# Patient Record
Sex: Female | Born: 1960 | Race: White | Hispanic: No | Marital: Married | Smoking: Never smoker
Health system: Southern US, Community
[De-identification: ages and names within clinical notes are randomized; demographics above are authoritative.]

## PROBLEM LIST (undated history)

## (undated) DIAGNOSIS — T7840XA Allergy, unspecified, initial encounter: Secondary | ICD-10-CM

## (undated) DIAGNOSIS — R011 Cardiac murmur, unspecified: Secondary | ICD-10-CM

## (undated) DIAGNOSIS — M1990 Unspecified osteoarthritis, unspecified site: Secondary | ICD-10-CM

## (undated) DIAGNOSIS — R51 Headache: Secondary | ICD-10-CM

## (undated) DIAGNOSIS — J40 Bronchitis, not specified as acute or chronic: Secondary | ICD-10-CM

## (undated) DIAGNOSIS — J189 Pneumonia, unspecified organism: Secondary | ICD-10-CM

## (undated) DIAGNOSIS — K219 Gastro-esophageal reflux disease without esophagitis: Secondary | ICD-10-CM

## (undated) DIAGNOSIS — E039 Hypothyroidism, unspecified: Secondary | ICD-10-CM

## (undated) DIAGNOSIS — J302 Other seasonal allergic rhinitis: Secondary | ICD-10-CM

## (undated) DIAGNOSIS — R519 Headache, unspecified: Secondary | ICD-10-CM

## (undated) HISTORY — PX: KNEE ARTHROSCOPY: SUR90

## (undated) HISTORY — DX: Unspecified osteoarthritis, unspecified site: M19.90

## (undated) HISTORY — DX: Cardiac murmur, unspecified: R01.1

## (undated) HISTORY — DX: Allergy, unspecified, initial encounter: T78.40XA

## (undated) HISTORY — PX: COLONOSCOPY: SHX5424

## (undated) HISTORY — DX: Gastro-esophageal reflux disease without esophagitis: K21.9

---

## 1998-06-04 HISTORY — PX: CHOLECYSTECTOMY: SHX55

## 2000-04-22 ENCOUNTER — Observation Stay (HOSPITAL_COMMUNITY): Admission: RE | Admit: 2000-04-22 | Discharge: 2000-04-23 | Payer: Self-pay | Admitting: General Surgery

## 2000-04-22 ENCOUNTER — Encounter (INDEPENDENT_AMBULATORY_CARE_PROVIDER_SITE_OTHER): Payer: Self-pay

## 2002-01-01 ENCOUNTER — Ambulatory Visit (HOSPITAL_BASED_OUTPATIENT_CLINIC_OR_DEPARTMENT_OTHER): Admission: RE | Admit: 2002-01-01 | Discharge: 2002-01-01 | Payer: Self-pay | Admitting: Orthopedic Surgery

## 2003-06-05 HISTORY — PX: KNEE SURGERY: SHX244

## 2004-02-14 ENCOUNTER — Other Ambulatory Visit: Admission: RE | Admit: 2004-02-14 | Discharge: 2004-02-14 | Payer: Self-pay | Admitting: Internal Medicine

## 2004-02-14 ENCOUNTER — Ambulatory Visit (HOSPITAL_COMMUNITY): Admission: RE | Admit: 2004-02-14 | Discharge: 2004-02-14 | Payer: Self-pay | Admitting: Internal Medicine

## 2008-06-17 ENCOUNTER — Ambulatory Visit: Payer: Self-pay | Admitting: *Deleted

## 2008-06-17 ENCOUNTER — Ambulatory Visit: Payer: Self-pay | Admitting: Diagnostic Radiology

## 2008-06-17 ENCOUNTER — Other Ambulatory Visit: Admission: RE | Admit: 2008-06-17 | Discharge: 2008-06-17 | Payer: Self-pay | Admitting: *Deleted

## 2008-06-17 ENCOUNTER — Encounter (INDEPENDENT_AMBULATORY_CARE_PROVIDER_SITE_OTHER): Payer: Self-pay | Admitting: *Deleted

## 2008-06-17 ENCOUNTER — Ambulatory Visit (HOSPITAL_BASED_OUTPATIENT_CLINIC_OR_DEPARTMENT_OTHER): Admission: RE | Admit: 2008-06-17 | Discharge: 2008-06-17 | Payer: Self-pay | Admitting: *Deleted

## 2008-06-17 DIAGNOSIS — K219 Gastro-esophageal reflux disease without esophagitis: Secondary | ICD-10-CM

## 2008-06-17 DIAGNOSIS — M1990 Unspecified osteoarthritis, unspecified site: Secondary | ICD-10-CM | POA: Insufficient documentation

## 2008-06-17 DIAGNOSIS — J309 Allergic rhinitis, unspecified: Secondary | ICD-10-CM | POA: Insufficient documentation

## 2008-06-17 DIAGNOSIS — R17 Unspecified jaundice: Secondary | ICD-10-CM | POA: Insufficient documentation

## 2008-06-17 DIAGNOSIS — R011 Cardiac murmur, unspecified: Secondary | ICD-10-CM | POA: Insufficient documentation

## 2008-06-17 LAB — CONVERTED CEMR LAB
Basophils Absolute: 0.1 10*3/uL (ref 0.0–0.1)
CO2: 28 meq/L (ref 19–32)
Calcium: 8.7 mg/dL (ref 8.4–10.5)
Cholesterol: 159 mg/dL (ref 0–200)
Eosinophils Relative: 2.5 % (ref 0.0–5.0)
GFR calc Af Amer: 68 mL/min
GFR calc non Af Amer: 57 mL/min
Glucose, Bld: 84 mg/dL (ref 70–99)
HDL: 51.4 mg/dL (ref >39.0)
MCHC: 34.2 g/dL (ref 30.0–36.0)
Monocytes Absolute: 0.5 10*3/uL (ref 0.1–1.0)
Neutro Abs: 3.3 10*3/uL (ref 1.4–7.7)
Neutrophils Relative %: 62.4 % (ref 43.0–77.0)
Platelets: 248 10*3/uL (ref 150–400)
Potassium: 4 meq/L (ref 3.5–5.1)
RDW: 13.1 % (ref 11.5–14.6)
Sodium: 140 meq/L (ref 135–145)
TSH: 4.4 microintl units/mL (ref 0.35–5.50)
VLDL: 13 mg/dL (ref 0–40)

## 2008-10-13 ENCOUNTER — Ambulatory Visit: Payer: Self-pay | Admitting: Family Medicine

## 2008-11-22 ENCOUNTER — Ambulatory Visit: Payer: Self-pay | Admitting: Family Medicine

## 2008-11-22 DIAGNOSIS — J019 Acute sinusitis, unspecified: Secondary | ICD-10-CM

## 2009-03-24 ENCOUNTER — Ambulatory Visit: Payer: Self-pay | Admitting: Family Medicine

## 2009-03-24 DIAGNOSIS — J069 Acute upper respiratory infection, unspecified: Secondary | ICD-10-CM | POA: Insufficient documentation

## 2009-03-24 LAB — CONVERTED CEMR LAB: Rapid Strep: NEGATIVE

## 2009-08-30 ENCOUNTER — Ambulatory Visit: Payer: Self-pay | Admitting: Family Medicine

## 2009-09-13 ENCOUNTER — Ambulatory Visit: Payer: Self-pay | Admitting: Family Medicine

## 2009-09-13 ENCOUNTER — Ambulatory Visit: Payer: Self-pay | Admitting: Diagnostic Radiology

## 2009-09-13 ENCOUNTER — Ambulatory Visit (HOSPITAL_BASED_OUTPATIENT_CLINIC_OR_DEPARTMENT_OTHER): Admission: RE | Admit: 2009-09-13 | Discharge: 2009-09-13 | Payer: Self-pay | Admitting: Family Medicine

## 2009-09-13 DIAGNOSIS — L738 Other specified follicular disorders: Secondary | ICD-10-CM | POA: Insufficient documentation

## 2009-09-13 LAB — HM MAMMOGRAPHY: HM Mammogram: NORMAL

## 2009-09-14 LAB — CONVERTED CEMR LAB
ALT: 12 units/L (ref 0–35)
AST: 17 units/L (ref 0–37)
Basophils Relative: 0.8 % (ref 0.0–3.0)
CO2: 25 meq/L (ref 19–32)
Calcium: 8.4 mg/dL (ref 8.4–10.5)
Chloride: 107 meq/L (ref 96–112)
Cholesterol: 156 mg/dL (ref 0–200)
HDL: 49.8 mg/dL (ref >39.00)
Hemoglobin: 13.6 g/dL (ref 12.0–15.0)
LDL Cholesterol: 95 mg/dL (ref 0–99)
Lymphocytes Relative: 25.6 % (ref 12.0–46.0)
MCHC: 34.1 g/dL (ref 30.0–36.0)
MCV: 85 fL (ref 78.0–100.0)
Neutro Abs: 3.8 10*3/uL (ref 1.4–7.7)
Neutrophils Relative %: 64.1 % (ref 43.0–77.0)
Potassium: 3.9 meq/L (ref 3.5–5.1)
Sodium: 140 meq/L (ref 135–145)
TSH: 5.53 microintl units/mL — ABNORMAL HIGH (ref 0.35–5.50)
Total Bilirubin: 1 mg/dL (ref 0.3–1.2)
Total CHOL/HDL Ratio: 3
Total Protein: 7.3 g/dL (ref 6.0–8.3)
Triglycerides: 56 mg/dL (ref 0.0–149.0)

## 2009-09-19 ENCOUNTER — Telehealth (INDEPENDENT_AMBULATORY_CARE_PROVIDER_SITE_OTHER): Payer: Self-pay | Admitting: *Deleted

## 2009-09-19 LAB — CONVERTED CEMR LAB: Free T4: 0.9 ng/dL (ref 0.6–1.6)

## 2009-09-28 ENCOUNTER — Other Ambulatory Visit: Admission: RE | Admit: 2009-09-28 | Discharge: 2009-09-28 | Payer: Self-pay | Admitting: Family Medicine

## 2009-09-28 ENCOUNTER — Ambulatory Visit: Payer: Self-pay | Admitting: Family Medicine

## 2009-09-28 DIAGNOSIS — E559 Vitamin D deficiency, unspecified: Secondary | ICD-10-CM | POA: Insufficient documentation

## 2009-10-04 ENCOUNTER — Encounter (INDEPENDENT_AMBULATORY_CARE_PROVIDER_SITE_OTHER): Payer: Self-pay | Admitting: *Deleted

## 2009-10-04 LAB — CONVERTED CEMR LAB: Pap Smear: NEGATIVE

## 2009-10-11 ENCOUNTER — Ambulatory Visit: Payer: Self-pay | Admitting: Family Medicine

## 2009-11-16 ENCOUNTER — Ambulatory Visit: Payer: Self-pay | Admitting: Family Medicine

## 2009-12-22 ENCOUNTER — Ambulatory Visit: Payer: Self-pay | Admitting: Family Medicine

## 2009-12-22 DIAGNOSIS — L049 Acute lymphadenitis, unspecified: Secondary | ICD-10-CM | POA: Insufficient documentation

## 2009-12-23 LAB — CONVERTED CEMR LAB
HCT: 38.5 % (ref 36.0–46.0)
Lymphocytes Relative: 39.4 % (ref 12.0–46.0)
Lymphs Abs: 2.2 10*3/uL (ref 0.7–4.0)
MCV: 86.7 fL (ref 78.0–100.0)
Monocytes Relative: 10.7 % (ref 3.0–12.0)
Platelets: 285 10*3/uL (ref 150.0–400.0)
RBC: 4.45 M/uL (ref 3.87–5.11)
RDW: 13.9 % (ref 11.5–14.6)
WBC: 5.6 10*3/uL (ref 4.5–10.5)

## 2010-03-27 ENCOUNTER — Ambulatory Visit: Payer: Self-pay | Admitting: Family Medicine

## 2010-03-27 DIAGNOSIS — E079 Disorder of thyroid, unspecified: Secondary | ICD-10-CM | POA: Insufficient documentation

## 2010-03-27 DIAGNOSIS — L723 Sebaceous cyst: Secondary | ICD-10-CM

## 2010-03-27 LAB — CONVERTED CEMR LAB
Free T4: 0.72 ng/dL (ref 0.60–1.60)
T3, Free: 2.8 pg/mL (ref 2.3–4.2)

## 2010-06-08 ENCOUNTER — Ambulatory Visit
Admission: RE | Admit: 2010-06-08 | Discharge: 2010-06-08 | Payer: Self-pay | Source: Home / Self Care | Attending: Family Medicine | Admitting: Family Medicine

## 2010-06-19 ENCOUNTER — Ambulatory Visit
Admission: RE | Admit: 2010-06-19 | Discharge: 2010-06-19 | Payer: Self-pay | Source: Home / Self Care | Attending: Family Medicine | Admitting: Family Medicine

## 2010-07-04 NOTE — Assessment & Plan Note (Signed)
Summary: bad cough//lch   Vital Signs:  Patient profile:   50 year old female Weight:      197 pounds Temp:     98.3 degrees F oral Pulse rate:   68 / minute Pulse rhythm:   regular BP sitting:   132 / 84  (left arm) Cuff size:   large  Vitals Entered By: Army Fossa CMA (August 30, 2009 4:16 PM) CC: Pt here for cough x 3 weeks, worse at night. Has tried nyquil., Cough   History of Present Illness:  Cough      This is a 50 year old woman who presents with Cough.  The symptoms began 3 weeks ago.  The patient reports productive cough and wheezing, but denies non-productive cough, pleuritic chest pain, shortness of breath, exertional dyspnea, fever, hemoptysis, and malaise.  The patient denies the following symptoms: cold/URI symptoms, sore throat, nasal congestion, chronic rhinitis, weight loss, acid reflux symptoms, and peripheral edema.  The cough is worse with activity and lying down.  Ineffective prior treatments have included OTC cough medication.    Current Medications (verified): 1)  Advil 200 Mg Tabs (Ibuprofen) .... Over The Counter As Per The Bottle Instruction 2)  Claritin 10 Mg Tabs (Loratadine) .Marland Kitchen.. 1 By Mouth Daily. 3)  Nasonex 50 Mcg/act Susp (Mometasone Furoate) .... 2 Sprays Each Nostril Once Daily 4)  Zithromax Z-Pak 250 Mg Tabs (Azithromycin) .... As Directed 5)  Symbicort 160-4.5 Mcg/act Aero (Budesonide-Formoterol Fumarate) .... 2 Puffs Two Times A Day 6)  Tussionex Pennkinetic Er 8-10 Mg/57ml Lqcr (Chlorpheniramine-Hydrocodone) .Marland Kitchen.. 1 Tsp By Mouth At Bedtime As Needed  Allergies (verified): No Known Drug Allergies  Past History:  Past medical, surgical, family and social histories (including risk factors) reviewed for relevance to current acute and chronic problems.  Past Medical History: Reviewed history from 06/17/2008 and no changes required. Allergic rhinitis GERD - doing well with dietary / behavioral changes Osteoarthritis - advil over the counter  as needed  Cardiac Murmur - worked up in her 20's with ECHO, etc - benign murmur  Past Surgical History: Reviewed history from 06/17/2008 and no changes required. Knee surgery - Cadaver replacement of cartilage - 2005 Cholecystectomy  Family History: Reviewed history from 06/17/2008 and no changes required. Family History of Alcoholism/Addiction Family History of Arthritis Family History High cholesterol Family History Hypertension Family History of  Breast Cancer Family History of  Emotional/Mental Illness  Social History: Reviewed history from 11/22/2008 and no changes required. 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  Otoscopic examination reveals clear canals, tympanic membranes are intact bilaterally without bulging, retraction, inflammation or discharge. Hearing is grossly normal bilaterally. Nose:  External nasal examination shows no deformity or inflammation. Nasal mucosa are pink and moist without lesions or exudates. Mouth:  Oral mucosa and oropharynx without lesions or exudates.  Teeth in good repair. Neck:  No deformities, masses, or tenderness noted. Lungs:  R wheezes and L wheezes.   Heart:  normal rate and no murmur.   Psych:  Oriented X3 and normally interactive.     Impression & Recommendations:  Problem # 1:  BRONCHITIS- ACUTE (ICD-466.0)  Her updated medication list for this problem includes:    Zithromax Z-pak  250 Mg Tabs (Azithromycin) .Marland Kitchen... As directed    Symbicort 160-4.5 Mcg/act Aero (Budesonide-formoterol fumarate) .Marland Kitchen... 2 puffs two times a day    Tussionex Pennkinetic Er 8-10 Mg/36ml Lqcr (Chlorpheniramine-hydrocodone)  .Marland Kitchen... 1 tsp by mouth at bedtime as needed  Take antibiotics and other medications as directed. Encouraged to push clear liquids, get enough rest, and take acetaminophen as needed. To be seen in 5-7 days if no improvement, sooner if worse.  Complete Medication List: 1)  Advil 200 Mg Tabs (Ibuprofen) .... Over the counter as per the bottle instruction 2)  Claritin 10 Mg Tabs (Loratadine) .Marland Kitchen.. 1 by mouth daily. 3)  Nasonex 50 Mcg/act Susp (Mometasone furoate) .... 2 sprays each nostril once daily 4)  Zithromax Z-pak 250 Mg Tabs (Azithromycin) .... As directed 5)  Symbicort 160-4.5 Mcg/act Aero (Budesonide-formoterol fumarate) .... 2 puffs two times a day 6)  Tussionex Pennkinetic Er 8-10 Mg/61ml Lqcr (Chlorpheniramine-hydrocodone) .Marland Kitchen.. 1 tsp by mouth at bedtime as needed Prescriptions: TUSSIONEX PENNKINETIC ER 8-10 MG/5ML LQCR (CHLORPHENIRAMINE-HYDROCODONE) 1 tsp by mouth at bedtime as needed  #6  oz x 0   Entered and Authorized by:   Loreen Freud DO   Signed by:   Loreen Freud DO on 08/30/2009   Method used:   Print then Give to Patient   RxID:   [id number] ZITHROMAX Z-PAK 250 MG TABS (AZITHROMYCIN) as directed  #1 x 0   Entered and Authorized by:   Loreen Freud DO   Signed by:   Loreen Freud DO on 08/30/2009   Method used:   Electronically to        [facility] (retail)       [address]       [place], [place]  [address]       Ph: [phone] or [phone]       Fax: [phone]   RxID:   [id number]

## 2010-07-04 NOTE — Assessment & Plan Note (Signed)
Summary: swollen glands in neck//kn   Vital Signs:  Patient profile:   50 year old female Height:      62.50 inches (158.75 cm) Weight:      199.50 pounds (90.68 kg) BMI:     36.04 Temp:     97.9 degrees F (36.61 degrees C) oral BP sitting:   120 / 70  (left arm) Cuff size:   regular  Vitals Entered By: Lucious Groves CMA (December 22, 2009 4:13 PM) CC: C/O glands swolen in neck./kb Is Patient Diabetic? No Pain Assessment Patient in pain? yes     Location: ears/neck Intensity: 4 Type: aching Onset of pain  5 days ago Comments Patient notes that the pain is bilateral behind the ears and accross the back of the neck. Patient has had HA, but denies fever./kb   History of Present Illness: 50 yo woman here today for swollen glands in neck.  behind R ear and on L scalp and neck.  + pain.  first noticed on Sunday.  no fevers, no ear pain, sore throat, nasal congestion.  only thing bothering pt is dry scalp condition that flared on Friday/Saturday.  no known sick contacts.  Current Medications (verified): 1)  Advil 200 Mg Tabs (Ibuprofen) .... Over The Counter As Per The Bottle Instruction 2)  Zyrtec Allergy 10 Mg Tabs (Cetirizine Hcl) .Marland Kitchen.. 1 Qd 3)  Nasonex 50 Mcg/act Susp (Mometasone Furoate) .... 2 Sprays Each Nostril Once Daily  Allergies (verified): No Known Drug Allergies  Past History:  Past Medical History: Last updated: 06/17/2008 Allergic rhinitis GERD - doing well with dietary / behavioral changes Osteoarthritis - advil over the counter as needed  Cardiac Murmur - worked up in her 20's with ECHO, etc - benign murmur  Review of Systems      See HPI  Physical Exam  General:  Well-developed,well-nourished,in no acute distress; alert,appropriate and cooperative throughout examination Head:  Normocephalic and atraumatic without obvious abnormalities. No apparent alopecia or balding. Ears:  External ear exam shows no significant lesions or deformities.  Otoscopic  examination reveals clear canals, tympanic membranes are intact bilaterally without bulging, retraction, inflammation or discharge. Hearing is grossly normal bilaterally. Nose:  External nasal examination shows no deformity or inflammation. Nasal mucosa are pink and moist without lesions or exudates. Mouth:  Oral mucosa and oropharynx without lesions or exudates.  Teeth in good repair. Lungs:  Normal respiratory effort, chest expands symmetrically. Lungs are clear to auscultation, no crackles or wheezes. Cervical Nodes:  R posterior auricular LAD, 3 <1cm firm, mobile nodes, mildly TTP 1 cm L post chain LN, mildly TTP Axillary Nodes:  No palpable lymphadenopathy Inguinal Nodes:  No significant adenopathy   Impression & Recommendations:  Problem # 1:  ACUTE LYMPHADENITIS (ICD-683) Assessment New pt w/ multiple small LNs, mildly TTP.  no other areas of LAD.  no evidence of infxn on exam.  check CBC.  abx script given for pt to fill if no improvement or if sxs are worsening.  reviewed supportive care and red flags that should prompt return.  Pt expresses understanding and is in agreement w/ this plan. Orders: Venipuncture ([address]109) TLB-CBC Platelet - w/Differential (85025-CBCD)  Her updated medication list for this problem includes:    Amoxicillin-pot Clavulanate 500-125 Mg Tabs (Amoxicillin-pot clavulanate) .Marland Kitchen... 1 tab by mouth two times a day x7 days  Complete Medication List: 1)  Advil 200 Mg Tabs (Ibuprofen) .... Over the counter as per the bottle instruction 2)  Zyrtec Allergy 10 Mg  Tabs (Cetirizine hcl) .Marland Kitchen.. 1 qd 3)  Nasonex 50 Mcg/act Susp (Mometasone furoate) .... 2 sprays each nostril once daily 4)  Amoxicillin-pot Clavulanate 500-125 Mg Tabs (Amoxicillin-pot clavulanate) .Marland Kitchen.. 1 tab by mouth two times a day x7 days 5)  Diflucan 150 Mg Tabs (Fluconazole) .... Once daily.  may repeat in 3 days if sxs persist  Patient Instructions: 1)  Follow up as needed 2)  We'll notify you of your  lab work 3)  Tylenol/Ibuprofen as needed for pain/fever 4)  If no improvement by early next week or if at all worsening, start the antibiotics (take with food) 5)  Take the Diflucan only if you develop yeast symptoms 6)  Hang in there! Prescriptions: DIFLUCAN 150 MG TABS (FLUCONAZOLE) once daily.  may repeat in 3 days if sxs persist  #2 x 0   Entered and Authorized by:   Neena Rhymes MD   Signed by:   Neena Rhymes MD on 12/22/2009   Method used:   Print then Give to Patient   RxID:   [id number] AMOXICILLIN-POT CLAVULANATE 500-125 MG TABS (AMOXICILLIN-POT CLAVULANATE) 1 tab by mouth two times a day x7 days  #14 x 0   Entered and Authorized by:   Neena Rhymes MD   Signed by:   Neena Rhymes MD on 12/22/2009   Method used:   Print then Give to Patient   RxID:   [id number]

## 2010-07-04 NOTE — Letter (Signed)
Summary: Results Follow up Letter  [facility]  [address] [place], [place] [address]   Phone: [phone]  Fax: [phone]    10/04/2009 MRN: [id number]  Sarah Galvan [address] [place], [place]  [address]  Dear Ms. Diles,  The following are the results of your recent test(s):  Test         Result    Pap Smear:        Normal __X___  Not Normal _____ Comments: REPEAT 1 YR. ______________________________________________________ Cholesterol: LDL(Bad cholesterol):         Your goal is less than:         HDL (Good cholesterol):       Your goal is more than: Comments:  ______________________________________________________ Mammogram:        Normal _____  Not Normal _____ Comments:  ___________________________________________________________________ Hemoccult:        Normal _____  Not normal _______ Comments:    _____________________________________________________________________ Other Tests:    We routinely do not discuss normal results over the telephone.  If you desire a copy of the results, or you have any questions about this information we can discuss them at your next office visit.   Sincerely,

## 2010-07-04 NOTE — Assessment & Plan Note (Signed)
Summary: ? SINUS INFECTION/RH......   Vital Signs:  Patient profile:   50 year old Galvan Weight:      199.13 pounds Temp:     98.2 degrees F oral Pulse rate:   65 / minute Pulse rhythm:   regular BP sitting:   118 / 78  (left arm) Cuff size:   large  Vitals Entered By: Army Fossa CMA (Oct 11, 2009 10:48 AM) CC: Pt here c/o sinus drainage, pain in face and teeth. onset sunday, URI symptoms   History of Present Illness:       This is a 50 year old woman who presents with URI symptoms.  The symptoms began 3 days ago.  Pt taking mucinex and zyrtec---has not been using nasonex.  The patient complains of nasal congestion, purulent nasal discharge, dry cough, earache, and sick contacts.  The patient denies fever, low-grade fever (<100.5 degrees), fever of 100.5-103 degrees, fever of 103.1-104 degrees, fever to >104 degrees, stiff neck, dyspnea, wheezing, rash, vomiting, diarrhea, use of an antipyretic, and response to antipyretic.  The patient also reports headache.  The patient denies the following risk factors for Strep sinusitis: unilateral facial pain, unilateral nasal discharge, poor response to decongestant, double sickening, tooth pain, Strep exposure, tender adenopathy, and absence of cough.    Current Medications (verified): 1)  Advil 200 Mg Tabs (Ibuprofen) .... Over The Counter As Per The Bottle Instruction 2)  Zyrtec Allergy 10 Mg Tabs (Cetirizine Hcl) .Marland Kitchen.. 1 Qd 3)  Nasonex 50 Mcg/act Susp (Mometasone Furoate) .... 2 Sprays Each Nostril Once Daily 4)  Symbicort 160-4.5 Mcg/act Aero (Budesonide-Formoterol Fumarate) .... 2 Puffs Two Times A Day 5)  Ergocalciferol 50000 Unit Caps (Ergocalciferol) .Marland Kitchen.. 1 By Mouth Weekly For 8 Weeks 6)  Augmentin 875-125 Mg Tabs (Amoxicillin-Pot Clavulanate) .Marland Kitchen.. 1 By Mouth Two Times A Day  Allergies (verified): No Known Drug Allergies  Past History:  Past medical, surgical, family and social histories (including risk factors) reviewed for  relevance to current acute and chronic problems.  Past Medical History: Reviewed history from 06/17/2008 and no changes required. Allergic rhinitis GERD - doing well with dietary / behavioral changes Osteoarthritis - advil over the counter as needed  Cardiac Murmur - worked up in her 20's with ECHO, etc - benign murmur  Past Surgical History: Reviewed history from 06/17/2008 and no changes required. Knee surgery - Cadaver replacement of cartilage - 2005 Cholecystectomy  Family History: Reviewed history from 09/13/2009 and no changes required. Family History of Alcoholism/Addiction Family History of Arthritis Family History High cholesterol Family History Hypertension Family History of  Breast Cancer Family History of  Emotional/Mental Illness Father died of Lung Cancer 10/10  Social History: Reviewed history from 11/22/2008 and no changes required. 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  Otoscopic examination reveals clear canals, tympanic membranes are intact bilaterally without bulging, retraction, inflammation or discharge. Hearing is grossly normal bilaterally. Nose:  L frontal sinus tenderness, L maxillary sinus tenderness, R frontal sinus tenderness, and R maxillary sinus tenderness.   Mouth:  Oral mucosa and oropharynx without lesions or exudates.  Teeth in good repair. Neck:  No deformities, masses, or tenderness noted. Lungs:  Normal respiratory effort, chest expands symmetrically. Lungs  are clear to auscultation, no crackles or wheezes. Heart:  normal rate and no murmur.   Psych:  Oriented X3 and normally interactive.     Impression &  Recommendations:  Problem # 1:  SINUSITIS - ACUTE-NOS (ICD-461.9)  Her updated medication list for this problem includes:    Nasonex 50 Mcg/act Susp (Mometasone furoate) .Marland Kitchen... 2 sprays each nostril once daily    Augmentin 875-125 Mg Tabs (Amoxicillin-pot clavulanate) .Marland Kitchen... 1 by mouth two times a day   con't zyrtec and mucinex  Instructed on treatment. Call if symptoms persist or worsen.   Complete Medication List: 1)  Advil 200 Mg Tabs (Ibuprofen) .... Over the counter as per the bottle instruction 2)  Zyrtec Allergy 10 Mg Tabs (Cetirizine hcl) .Marland Kitchen.. 1 qd 3)  Nasonex 50 Mcg/act Susp (Mometasone furoate) .... 2 sprays each nostril once daily 4)  Symbicort 160-4.5 Mcg/act Aero (Budesonide-formoterol fumarate) .... 2 puffs two times a day 5)  Ergocalciferol 50000 Unit Caps (Ergocalciferol) .Marland Kitchen.. 1 by mouth weekly for 8 weeks 6)  Augmentin 875-125 Mg Tabs (Amoxicillin-pot clavulanate) .Marland Kitchen.. 1 by mouth two times a day Prescriptions: NASONEX 50 MCG/ACT SUSP (MOMETASONE FUROATE) 2 sprays each nostril once daily  #1 x 5   Entered and Authorized by:   Loreen Freud DO   Signed by:   Loreen Freud DO on 10/11/2009   Method used:   Print then Give to Patient   RxID:   [id number] AUGMENTIN 875-125 MG TABS (AMOXICILLIN-POT CLAVULANATE) 1 by mouth two times a day  #20 x 0   Entered and Authorized by:   Loreen Freud DO   Signed by:   Loreen Freud DO on 10/11/2009   Method used:   Electronically to        [facility] (retail)       [address]       [place], [place]  [address]       Ph: [phone] or [phone]       Fax: [phone]   RxID:   [id number]

## 2010-07-04 NOTE — Assessment & Plan Note (Signed)
Summary: CYST ON THE BACK OF NECK/KN   Vital Signs:  Patient profile:   50 year old female Weight:      204 pounds BMI:     36.85 Pulse rate:   83 / minute BP sitting:   116 / 70  (left arm)  Vitals Entered By: Doristine Devoid CMA (March 27, 2010 1:45 PM) CC: cyst on back of neck red and tender    History of Present Illness: 50 yo woman here today for cyst on back of neck.  painful.  first noticed as a 'tiny thing 2 weeks ago' and then last wednesday 'it blew up'.  swollen, no drainage.  'it hurts'.  abnormal TSH- due for repeat labs.  Current Medications (verified): 1)  Advil 200 Mg Tabs (Ibuprofen) .... Over The Counter As Per The Bottle Instruction 2)  Zyrtec Allergy 10 Mg Tabs (Cetirizine Hcl) .Marland Kitchen.. 1 Qd 3)  Nasonex 50 Mcg/act Susp (Mometasone Furoate) .... 2 Sprays Each Nostril Once Daily 4)  Doxycycline Hyclate 100 Mg Caps (Doxycycline Hyclate) .... Take 1 Tab Twice A Day.  Take W/ Food.  Allergies (verified): No Known Drug Allergies  Review of Systems      See HPI  Physical Exam  General:  Well-developed,well-nourished,in no acute distress; alert,appropriate and cooperative throughout examination Neck:   ~1 cm area of induration and redness on L posterior neck.  small central pore w/ tiny amount of visible pus.  no obvious abscess   Impression & Recommendations:  Problem # 1:  SEBACEOUS CYST, INFECTED (ICD-706.2) Assessment New pt w/out obvious abscess or fluctuance that would benefit from I&D.  will start abx and cover for MRSA.  reviewed supportive care and red flags that should prompt return.  Pt expresses understanding and is in agreement w/ this plan.  Problem # 2:  THYROID STIMULATING HORMONE, ABNORMAL (ICD-246.9) Assessment: New recheck labs today as it has been 6 months. Orders: Venipuncture (16109) TLB-TSH (Thyroid Stimulating Hormone) (84443-TSH) TLB-T3, Free (Triiodothyronine) (84481-T3FREE) TLB-T4 (Thyrox), Free [id number] Specimen Handling  (99000)  Complete Medication List: 1)  Advil 200 Mg Tabs (Ibuprofen) .... Over the counter as per the bottle instruction 2)  Zyrtec Allergy 10 Mg Tabs (Cetirizine hcl) .Marland Kitchen.. 1 qd 3)  Nasonex 50 Mcg/act Susp (Mometasone furoate) .... 2 sprays each nostril once daily 4)  Doxycycline Hyclate 100 Mg Caps (Doxycycline hyclate) .... Take 1 tab twice a day.  take w/ food.  Patient Instructions: 1)  Follow up as needed 2)  Take the Doxycycline as directed 3)  Hot compresses will help facilitate drainage 4)  Ibuprofen will help w/ pain 5)  Call with any questions or concerns- or if worsening 6)  Hang in there!!! Prescriptions: DOXYCYCLINE HYCLATE 100 MG CAPS (DOXYCYCLINE HYCLATE) Take 1 tab twice a day.  take w/ food.  #20 x 0   Entered and Authorized by:   Neena Rhymes MD   Signed by:   Neena Rhymes MD on 03/27/2010   Method used:   Electronically to        [facility] (retail)       [address]       [place], [place]  [address]       Ph: [phone] or [phone]       Fax: [phone]   RxID:   [id number]    Orders Added: 1)  Venipuncture [25956] 2)  TLB-TSH (Thyroid Stimulating Hormone) [84443-TSH] 3)  TLB-T3, Free (Triiodothyronine) [id number] 4)  TLB-T4 (Thyrox), Free [61607-PX1G] 5)  Specimen Handling [99000] 6)  Est. Patient Level III [62694]

## 2010-07-04 NOTE — Progress Notes (Signed)
Summary: labs  Phone Note Outgoing Call   Call placed by: Doristine Devoid,  September 19, 2009 12:08 PM Call placed to: Patient Summary of Call: T3/T4 normal at this time.  will need to recheck in 6 months, sooner if pt has symptoms of fatigue, cold intolerance, hair loss or other concerns  pt needs 50,000 units weekly x8 weeks and then recheck labs  Follow-up for Phone Call        left message on machine .......Marland KitchenDoristine Devoid  September 19, 2009 12:08 PM   patient aware of labs.Marland KitchenMarland KitchenMarland KitchenDoristine Devoid  September 20, 2009 8:47 AM      Appended Document: labs SPOKE WIT PT INFORMED OF VIT D LAB AND RX FAXED TO [facility] SCHEDULED BY SCHEDULER

## 2010-07-04 NOTE — Assessment & Plan Note (Signed)
Summary: CPX AND FASTING LABS///SPH   Vital Signs:  Patient profile:   50 year old female Height:      62.50 inches Weight:      195 pounds BMI:     35.22 Pulse rate:   62 / minute BP sitting:   120 / 80  (left arm)  Vitals Entered By: Doristine Devoid (September 13, 2009 8:24 AM) CC: CPX AND LABS    History of Present Illness: 50 yo woman here today for CPE.  wants to defer pap today due to menses.  only concern is itchy scalp across back of head w/ occasional 'pimples'.  reports it was much worse in winter when wearing wool hats and coat w/ wool collar.  no redness or flaking.  no sxs on top or sides of head.  no one in house w/ similar sxs.  Preventive Screening-Counseling & Management  Alcohol-Tobacco     Smoking Status: never  Caffeine-Diet-Exercise     Does Patient Exercise: yes      Sexual History:  currently monogamous.        Drug Use:  never.    Current Medications (verified): 1)  Advil 200 Mg Tabs (Ibuprofen) .... Over The Counter As Per The Bottle Instruction 2)  Claritin 10 Mg Tabs (Loratadine) .Marland Kitchen.. 1 By Mouth Daily. 3)  Nasonex 50 Mcg/act Susp (Mometasone Furoate) .... 2 Sprays Each Nostril Once Daily 4)  Symbicort 160-4.5 Mcg/act Aero (Budesonide-Formoterol Fumarate) .... 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 Married 2 children Never Smoked Alcohol use-yes  Family History: Family History of Alcoholism/Addiction Family  History of Arthritis Family History High cholesterol Family History Hypertension Family History of  Breast Cancer Family History of  Emotional/Mental Illness Father died of Lung Cancer 10/10  Social History: Sexual History:  currently monogamous Drug Use:  never  Review of Systems  The patient denies anorexia, fever, weight loss, weight gain, vision loss, decreased hearing, hoarseness, chest pain, syncope, dyspnea on exertion, peripheral edema, prolonged cough, headaches, abdominal pain, melena, hematochezia, severe indigestion/heartburn, hematuria, suspicious skin lesions, depression, abnormal bleeding, enlarged lymph nodes, and breast masses.    Physical Exam  General:  Well-developed,well-nourished,in no acute distress; alert,appropriate and cooperative throughout examination Head:  Normocephalic and atraumatic without obvious abnormalities. No apparent alopecia or balding. Eyes:  No corneal or conjunctival inflammation noted. EOMI. Perrla. Funduscopic exam benign, without hemorrhages, exudates or papilledema. Vision grossly normal. Ears:  External ear exam shows no significant lesions or deformities.  Otoscopic examination reveals clear canals, tympanic membranes are intact bilaterally without bulging, retraction, inflammation or discharge. Hearing is grossly normal bilaterally. Nose:  External nasal examination shows no deformity or inflammation. Nasal mucosa are pink and moist without lesions or exudates. Mouth:  Oral mucosa and oropharynx without lesions or exudates.  Teeth in good repair. Neck:  No deformities, masses, or tenderness noted. Breasts:  No mass, nodules, thickening, tenderness, bulging, retraction, inflamation, nipple discharge or skin changes noted.   Lungs:  Normal respiratory effort, chest expands symmetrically.  Lungs are clear to auscultation, no crackles or wheezes. Heart:  Normal rate and regular rhythm. S1 and S2 normal without gallop, murmur, click, rub or other  extra sounds. Abdomen:  Bowel sounds positive,abdomen soft and non-tender without masses, organomegaly or hernias noted. Genitalia:  deferred due to menses Msk:  No deformity or scoliosis noted of thoracic or lumbar spine.   Pulses:  +2 carotid, radial, DP Extremities:  No clubbing, cyanosis, edema, or deformity noted with normal full range of motion of all joints.   Neurologic:  No cranial nerve deficits noted. Station and gait are normal. Plantar reflexes are down-going bilaterally. DTRs are symmetrical throughout. Sensory, motor and coordinative functions appear intact. Skin:  Intact without suspicious lesions or rashes Cervical Nodes:  No lymphadenopathy noted Axillary Nodes:  No palpable lymphadenopathy Psych:  Cognition and judgment appear intact. Alert and cooperative with normal attention span and concentration. No apparent delusions, illusions, hallucinations   Impression & Recommendations:  Problem # 1:  ROUTINE GYNECOLOGICAL EXAMINATION (ICD-V72.31) Assessment Unchanged pt's PE WNL.  pt to return for pap.  labs as below.  encouraged healthy diet and regular exercise.  will arrange mammo. Orders: Venipuncture (10272) TLB-Lipid Panel (80061-LIPID) TLB-BMP (Basic Metabolic Panel-BMET) (80048-METABOL) TLB-CBC Platelet - w/Differential (85025-CBCD) TLB-Hepatic/Liver Function Pnl (80076-HEPATIC) TLB-TSH (Thyroid Stimulating Hormone) (84443-TSH) T-Vitamin D (25-Hydroxy) (53664-40347) Radiology Referral (Radiology)  Problem # 2:  FOLLICULITIS (ICD-704.8) Assessment: New no sxs today and pt reports they have dramatically improved since she stopped wearing wool hats and her coat w/ a wool collar.  likely irritative.  no need for tx at this time.  Complete Medication List: 1)  Advil 200 Mg Tabs (Ibuprofen) .... Over the counter as per the bottle instruction 2)  Claritin 10 Mg Tabs (Loratadine) .Marland Kitchen.. 1 by mouth daily. 3)  Nasonex 50 Mcg/act Susp (Mometasone furoate) .... 2 sprays  each nostril once daily 4)  Symbicort 160-4.5 Mcg/act Aero (Budesonide-formoterol fumarate) .... 2 puffs two times a day  Patient Instructions: 1)  Please schedule a follow-up appointment in 1 year or as needed. 2)  We'll notify you of your lab results 3)  Keep up the good work on diet and exercise 4)  Call with any questions or concerns 5)  Someone will call you with your mammogram information 6)  Have a great summer!!

## 2010-07-04 NOTE — Assessment & Plan Note (Signed)
Summary: PAP ONLY/CDJ   Vital Signs:  Patient profile:   50 year old female Height:      62.50 inches Weight:      198 pounds Pulse rate:   58 / minute BP sitting:   110 / 80  Vitals Entered By: Kandice Hams (September 28, 2009 3:40 PM) CC: pap    History of Present Illness: 50 yo woman here today for pap.  no concerns.  reviewed labs from last visit.  Allergies (verified): No Known Drug Allergies  Physical Exam  General:  Well-developed,well-nourished,in no acute distress; alert,appropriate and cooperative throughout examination Genitalia:  Pelvic Exam:        External: normal female genitalia without lesions or masses        Vagina: normal without lesions or masses        Cervix: normal without lesions or masses        Adnexa: normal bimanual exam without masses or fullness        Uterus: normal by palpation        Pap smear: performed   Impression & Recommendations:  Problem # 1:  SCREENING FOR MALIGNANT NEOPLASM OF THE CERVIX (ICD-V76.2) Assessment New pap collected  Problem # 2:  VITAMIN D DEFICIENCY (ICD-268.9) Assessment: New discussed labs.  prescription sent.  pt aware of need for recheck.  Complete Medication List: 1)  Advil 200 Mg Tabs (Ibuprofen) .... Over the counter as per the bottle instruction 2)  Zyrtec Allergy 10 Mg Tabs (Cetirizine hcl) .Marland Kitchen.. 1 qd 3)  Nasonex 50 Mcg/act Susp (Mometasone furoate) .... 2 sprays each nostril once daily 4)  Symbicort 160-4.5 Mcg/act Aero (Budesonide-formoterol fumarate) .... 2 puffs two times a day 5)  Ergocalciferol 50000 Unit Caps (Ergocalciferol) .Marland Kitchen.. 1 by mouth weekly for 8 weeks  Patient Instructions: 1)  We'll notify you of your pap results 2)  Call with any questions or concerns 3)  Have a great summer!

## 2010-07-06 NOTE — Assessment & Plan Note (Signed)
Summary: COLD AND SINUS//PH   Vital Signs:  Patient profile:   50 year old female Weight:      203 pounds BMI:     36.67 Temp:     98.2 degrees F oral BP sitting:   120 / 82  (left arm)  Vitals Entered By: Doristine Devoid CMA (June 08, 2010 1:06 PM) CC: sinus congestion and cough x1 wk    History of Present Illness: 50 yo woman here today for ? sinus infxn.  sxs started w/ sore throat 1 week ago.  has been using Mucinex w/out relief.  no fevers.  now coughing at night, unable to sleep.  + facial pain/pressure.  R ear pain.  cough is productive of clear sputum.  + sick contacts.  Current Medications (verified): 1)  Advil 200 Mg Tabs (Ibuprofen) .... Over The Counter As Per The Bottle Instruction 2)  Zyrtec Allergy 10 Mg Tabs (Cetirizine Hcl) .Marland Kitchen.. 1 Qd 3)  Nasonex 50 Mcg/act Susp (Mometasone Furoate) .... 2 Sprays Each Nostril Once Daily  Allergies (verified): No Known Drug Allergies  Review of Systems      See HPI  Physical Exam  General:  Well-developed,well-nourished,in no acute distress; alert,appropriate and cooperative throughout examination Head:  Normocephalic and atraumatic without obvious abnormalities. No apparent alopecia or balding.  no TTP over sinuses Eyes:  no injxn or inflammation Ears:  R TM bulging w/ yellow, cloudy fluid L TM w/ yellow fluid inferiorly Nose:  External nasal examination shows no deformity or inflammation. Nasal mucosa are pink and moist without lesions or exudates.  + congestion Mouth:  Oral mucosa and oropharynx without lesions or exudates.  Teeth in good repair. Neck:  + anterior LAD on R Lungs:  Normal respiratory effort, chest expands symmetrically. Lungs are clear to auscultation, no crackles or wheezes.  + hacking cough Heart:  normal rate and no murmur.     Impression & Recommendations:  Problem # 1:  UNSPECIFIED OTITIS MEDIA (ICD-382.9) Assessment New given appearance of both ears start amox.  reviewed supportive care and red  flags that should prompt return.  Pt expresses understanding and is in agreement w/ this plan. Her updated medication list for this problem includes:    Advil 200 Mg Tabs (Ibuprofen) ..... Over the counter as per the bottle instruction    Amoxicillin 500 Mg Tabs (Amoxicillin) .Marland Kitchen... 1 tab by mouth two times a day x10 days  Problem # 2:  COUGH (ICD-786.2) Assessment: New likely due to drainage.  start cough meds as needed.  Complete Medication List: 1)  Advil 200 Mg Tabs (Ibuprofen) .... Over the counter as per the bottle instruction 2)  Zyrtec Allergy 10 Mg Tabs (Cetirizine hcl) .Marland Kitchen.. 1 qd 3)  Nasonex 50 Mcg/act Susp (Mometasone furoate) .... 2 sprays each nostril once daily 4)  Amoxicillin 500 Mg Tabs (Amoxicillin) .Marland Kitchen.. 1 tab by mouth two times a day x10 days 5)  Tessalon 200 Mg Caps (Benzonatate) .... Take one capsule by mouth three times a day as needed for cough 6)  Cheratussin Ac 100-10 Mg/24ml Syrp (Guaifenesin-codeine) .Marland Kitchen.. 1-2 tsp q4-6 as needed for cough.  disp  Patient Instructions: 1)  This is an ear infection 2)  Take the Amoxicillin as directed- take w/ food to avoid upset stomach 3)  Continue the Mucinex as needed 4)  Cough meds as needed- syrup will make you drowsy 5)  Tylenol/ibuprofen as needed for pain or fever 6)  Hang in there!!! Prescriptions: CHERATUSSIN AC 100-10  MG/5ML SYRP (GUAIFENESIN-CODEINE) 1-2 tsp Q4-6 as needed for cough.  disp  #150 x 0   Entered and Authorized by:   Neena Rhymes MD   Signed by:   Neena Rhymes MD on 06/08/2010   Method used:   Print then Give to Patient   RxID:   [id number] TESSALON 200 MG CAPS (BENZONATATE) Take one capsule by mouth three times a day as needed for cough  #60 x 0   Entered and Authorized by:   Neena Rhymes MD   Signed by:   Neena Rhymes MD on 06/08/2010   Method used:   Electronically to        [facility] [address] (retail)       [address]       [place], [place]  [address]       Ph: [phone] or [phone]       Fax: [phone]   RxID:   [id number] AMOXICILLIN 500 MG TABS (AMOXICILLIN) 1 tab by mouth two times a day x10 days  #20 x 0   Entered and Authorized by:   Neena Rhymes MD   Signed by:   Neena Rhymes MD on 06/08/2010   Method used:   Electronically to        [facility] [address] (retail)       [address]       [place], [place]  [address]       Ph: [phone] or [phone]       Fax: [phone]   RxID:   [id number]    Orders Added: 1)  Est. Patient Level III [46270]

## 2010-07-06 NOTE — Assessment & Plan Note (Signed)
Summary: congestion//cough//lch   Vital Signs:  Patient profile:   50 year old female Weight:      202 pounds BMI:     36.49 Pulse rate:   70 / minute BP sitting:   118 / 80  (left arm)  Vitals Entered By: Doristine Devoid CMA (June 19, 2010 9:07 AM) CC: still w/ cough and congestion    History of Present Illness: 50 yo woman here today for continued cough and congestion.  finished abx 2 days ago.  woke up last night coughing- had to take cough meds again.  R ear still painful.  congestion has improved- no longer w/ sinus pain or pressure.  no fevers.  cough is intermittantly productive.  'my chest feels tighter than before, the cough is thicker'.  Current Medications (verified): 1)  Advil 200 Mg Tabs (Ibuprofen) .... Over The Counter As Per The Bottle Instruction 2)  Zyrtec Allergy 10 Mg Tabs (Cetirizine Hcl) .Marland Kitchen.. 1 Qd 3)  Nasonex 50 Mcg/act Susp (Mometasone Furoate) .... 2 Sprays Each Nostril Once Daily 4)  Tessalon 200 Mg Caps (Benzonatate) .... Take One Capsule By Mouth Three Times A Day As Needed For Cough 5)  Cheratussin Ac 100-10 Mg/39ml Syrp (Guaifenesin-Codeine) .Marland Kitchen.. 1-2 Tsp Q4-6 As Needed For Cough.  Disp  Allergies (verified): No Known Drug Allergies  Review of Systems      See HPI  Physical Exam  General:  Well-developed,well-nourished,in no acute distress; alert,appropriate and cooperative throughout examination Head:  Normocephalic and atraumatic without obvious abnormalities. No apparent alopecia or balding.  no TTP over sinuses Eyes:  no injxn or inflammation Ears:  R TM w/ yellow, cloudy fluid inferiorly  Nose:  External nasal examination shows no deformity or inflammation. Nasal mucosa are pink and moist without lesions or exudates.  + congestion Mouth:  Oral mucosa and oropharynx without lesions or exudates.  Teeth in good repair. Lungs:  Normal respiratory effort, chest expands symmetrically. Lungs are clear to auscultation, no crackles or wheezes.  +  hacking cough Heart:  normal rate and no murmur.     Impression & Recommendations:  Problem # 1:  COUGH (ICD-786.2) Assessment Unchanged likely a bronchitis at this point as ears have improved on amox but cough has worsened.  start Zpack.  reviewed supportive care and red flags that should prompt return.  Pt expresses understanding and is in agreement w/ this plan.  Complete Medication List: 1)  Advil 200 Mg Tabs (Ibuprofen) .... Over the counter as per the bottle instruction 2)  Zyrtec Allergy 10 Mg Tabs (Cetirizine hcl) .Marland Kitchen.. 1 qd 3)  Nasonex 50 Mcg/act Susp (Mometasone furoate) .... 2 sprays each nostril once daily 4)  Tessalon 200 Mg Caps (Benzonatate) .... Take one capsule by mouth three times a day as needed for cough 5)  Cheratussin Ac 100-10 Mg/75ml Syrp (Guaifenesin-codeine) .Marland Kitchen.. 1-2 tsp q4-6 as needed for cough.  disp 6)  Azithromycin 250 Mg Tabs (Azithromycin) .... 2 by  mouth today and then 1 daily for 4 days  Patient Instructions: 1)  This appears to be a bronchitis 2)  Take the Azithromycin as directed 3)  Drink plenty of fluids 4)  Use the cough meds as needed 5)  Take the phenylephrine (decongestant) to get rid of the ear fluid 6)  Call with any questions or concerns 7)  Hang in there!! Prescriptions: CHERATUSSIN AC 100-10 MG/5ML SYRP (GUAIFENESIN-CODEINE) 1-2 tsp Q4-6 as needed for cough.  disp  #150 x 0  Entered and Authorized by:   Neena Rhymes MD   Signed by:   Neena Rhymes MD on 06/19/2010   Method used:   Print then Give to Patient   RxID:   [id number] AZITHROMYCIN 250 MG  TABS (AZITHROMYCIN) 2 by  mouth today and then 1 daily for 4 days  #6 x 0   Entered and Authorized by:   Neena Rhymes MD   Signed by:   Neena Rhymes MD on 06/19/2010   Method used:   Electronically to        [facility] [address] (retail)       [address]       [place], [place]  [address]       Ph: [phone] or  [phone]       Fax: [phone]   RxID:   [id number]    Orders Added: 1)  Est. Patient Level III [31517]

## 2010-10-12 ENCOUNTER — Encounter: Payer: Self-pay | Admitting: Family Medicine

## 2010-10-12 ENCOUNTER — Encounter: Payer: Self-pay | Admitting: *Deleted

## 2010-10-13 ENCOUNTER — Ambulatory Visit (INDEPENDENT_AMBULATORY_CARE_PROVIDER_SITE_OTHER): Payer: BC Managed Care – PPO | Admitting: Family Medicine

## 2010-10-13 ENCOUNTER — Encounter: Payer: Self-pay | Admitting: Family Medicine

## 2010-10-13 VITALS — BP 110/70 | Temp 98.5°F | Wt 197.4 lb

## 2010-10-13 DIAGNOSIS — J019 Acute sinusitis, unspecified: Secondary | ICD-10-CM

## 2010-10-13 LAB — HM MAMMOGRAPHY: HM Mammogram: NORMAL

## 2010-10-13 MED ORDER — GUAIFENESIN-CODEINE 100-10 MG/5ML PO SYRP: 5.0000 mL | ORAL_SOLUTION | ORAL | Status: DC

## 2010-10-13 MED ORDER — MOMETASONE FUROATE 50 MCG/ACT NA SUSP: 2.0000 | Freq: Every day | NASAL | Status: DC

## 2010-10-13 MED ORDER — AMOXICILLIN 875 MG PO TABS: 875.0000 mg | ORAL_TABLET | Freq: Two times a day (BID) | ORAL | Status: DC

## 2010-10-13 NOTE — Patient Instructions (Signed)
This is most consistent w/ a sinus infection Start the Amox- take w/ food Use the cough syrup as needed Restart the Nasonex Call w/ any questions or concerns Hang in there!!!

## 2010-10-13 NOTE — Assessment & Plan Note (Signed)
sxs and PE consistent w/ infxn.  Start amox.  Restart nasal spray.  Cough meds prn.  Reviewed supportive care and red flags that should prompt return.  Pt expressed understanding and is in agreement w/ plan.

## 2010-10-13 NOTE — Progress Notes (Signed)
  Subjective:    Patient ID: Sarah Galvan, female    DOB: [birth year], 50 y.o.   MRN: [id number]  HPI ? Sinus infxn- sxs started Monday w/ facial pain, nasal congestion, subjective fever.  Started mucinex D but sxs progressed as the week went on.  + cough- productive.  R ear pain.  + wheezing.  + sick contacts.   Review of Systems For ROS see HPI     Objective:   Physical Exam  Constitutional: She appears well-developed and well-nourished. She appears distressed.  HENT:  Head: Normocephalic and atraumatic.  Right Ear: Tympanic membrane normal.  Left Ear: Tympanic membrane normal.  Nose: Mucosal edema and rhinorrhea present. Right sinus exhibits maxillary sinus tenderness and frontal sinus tenderness. Left sinus exhibits no maxillary sinus tenderness and no frontal sinus tenderness.  Mouth/Throat: Uvula is midline and mucous membranes are normal. Posterior oropharyngeal erythema present. No oropharyngeal exudate.  Eyes: Conjunctivae and EOM are normal. Pupils are equal, round, and reactive to light.  Neck: Normal range of motion. Neck supple.  Cardiovascular: Normal rate, regular rhythm and normal heart sounds.   Pulmonary/Chest: Effort normal and breath sounds normal. No respiratory distress. She has no wheezes.  Lymphadenopathy:    She has no cervical adenopathy.          Assessment & Plan:

## 2010-10-20 NOTE — Op Note (Signed)
. [facility]  Patient:    Sarah Galvan, Sarah Galvan                   MRN: [id number] Proc. Date: 04/22/00 Adm. Date:  [id number] Attending:  Tempie Donning CC:         Hoy Register, M.D., [address] [place], [place]  Loyal Jacobson, M.D.   Operative Report  PREOPERATIVE DIAGNOSIS:  Abnormal gallbladder - cholecystitis.  POSTOPERATIVE DIAGNOSIS:  Abnormal gallbladder - cholecystitis.  OPERATION:  Laparoscopic cholecystectomy.  SURGEON:  Gita Kudo, M.D.  ASSISTANT:  Zigmund Daniel, M.D.  ANESTHESIA:  General endotracheal  CLINICAL SUMMARY:  A 50 year old female admitted for elective surgery.  Her father has had gallbladder disease.  The patient has had bouts of right upper quadrant abdominal pain with radiation to the back and associated with fatty food intolerance.  Her liver function study, CT scan, abdominal ultrasound were all normal.  A CCK stimulation showed normal biliary tree without obstruction but a slight decrease in ejection fraction.  Also the injection reproduced pain that she had been having.  OPERATIVE FINDINGS:  The gallbladder looked normal.  There was no evidence of any inflammation.  The cystic duct and artery were normal in size and anatomy.  OPERATIVE PROCEDURE:  Under satisfactory general endotracheal anesthesia, having received 1.0 gm Ancef preoperative, the patients abdomen was prepped and draped in a standard fashion.  A transverse incision made above the umbilicus and carried down into the mid line which was opened into the peritoneum.  Continued with a figure-of-eight 0 Vicryl suture and operating Hasson port inserted and secured.  Good CO2 pneumoperitoneum established and camera placed.  Under direct vision, two #5 ports placed laterally and a second #10 port medially.  With excellent exposure and vision, grasper was placed through the lateral port gave good retraction and operating through  the medial port.  I identified the gallbladder cystic duct junction and carefully dissected this area with a right angle clamp.  The cystic duct was carefully identified and circumferentially dissected, as was the cystic artery.  When certain of the anatomy, the structures were controlled with multiple metal clips and divided between the distal two.  The gallbladder was then removed from below upward using the coagulating spatula for hemostasis and dissection. A small hole was made in the gallbladder during the dissection and clear bile suctioned away.  The dissection was continued and the liver bed checked for hemostasis and made dry by cautery.  Then the gallbladder was amputated from the bed.  An endocatch bag was then placed through the upper port and the gallbladder placed in it and the bag secured.  Following this, the camera was moved to the upper port and the gallbladder extracted with a large grasper placed through the umbilical port.  Then the operative site was checked for hemostasis which was good and it was copiously lavaged with saline until the returns were clear.  There was no evidence of any bowel or blood in the abdomen at the end of the procedure.  Camera was used to identify the ports and the ports removed under direct vision and then all CO2 released.  The trocar sites had been infiltrated with Marcaine at insertion except for the umbilical port which was then infiltrated with Marcaine.  The midline was closed with a previous figure-of-eight as well as second interrupted 0 Vicryl suture.  Subcutaneous approximated with 4-0 Vicryl and then Steri-Strips used to approximate the skin.  Dressings applied and the patient went to the recovery room from the operating room in good condition without complication. DD:  04/22/00 TD:  04/23/00 Job: [id number]

## 2010-10-20 NOTE — Op Note (Signed)
NAME:  Sarah Galvan, Sarah Galvan                      ACCOUNT NO.:  [id number]   MEDICAL RECORD NO.:  [id number]                   PATIENT TYPE:   LOCATION:                                       FACILITY:   PHYSICIAN:  Loreta Ave, M.D.              DATE OF BIRTH:   DATE OF PROCEDURE:  01/01/2002  DATE OF DISCHARGE:                                 OPERATIVE REPORT   PREOPERATIVE DIAGNOSIS:  Osteochondral injury, lateral femoral condyle, with  lateral meniscus tear.   POSTOPERATIVE DIAGNOSES:  Chondral injury, lateral femoral condyle, full-  thickness, with chondral loose bodies.  Abrasive changes, lateral meniscus.   PROCEDURE:  Left knee exam under anesthesia, arthroscopy, removal of loose  bodies.  Debridement of margin of lateral meniscus.  Debridement of lateral  femoral condyle with treatment with microfracturing.   SURGEON:  Loreta Ave, M.D.   ASSISTANT:  Arlys John D. Petrarca, P.A.-C.   ANESTHESIA:  Knee block with sedation.   SPECIMENS:  None.   CULTURES:  None.   COMPLICATIONS:  None.   DRESSINGS:  Soft compressive.   DESCRIPTION OF PROCEDURE:  The patient was brought to the operating room and  placed on the operating table in supine position.  After adequate anesthesia  had been obtained, the left knee was examined.  Full motion, good stability.  Popping in McMurray's laterally.  Tourniquet, then leg holder applied, leg  prepped and draped in the usual sterile fashion.  Three portals created, one  superolateral, one each medial and lateral parapatellar.  Inflow catheter  introduced, knee distended, arthroscope introduced, knee inspected.  Previous partial lateral meniscectomy with a little fraying at the margins  but not a specific tear.  Articular cartilage of patellofemoral joint and  medial compartment normal.  Good patellofemoral tracking.  Medial meniscus  intact.  Cruciate ligaments intact.  One large full-thickness chondral loose  body found 6 to 7 mm  diameter and removed, emanating from posterior aspect  of medial femoral condyle where there was an obvious full-thickness chondral  fracture.  This was debrided back to healthy margins.  The bed, which was  about a centimeter in diameter after debridement, was then treated with  multiple drilling with the microfracture system to create bleeding bone.  The region injured was on the lateral aspect of the lateral femoral condyle,  hitting the tibia at about 30 degrees of flexion and then staying in contact  all the way to 90 degrees.  Once I had documented good bleeding from the  microfracturing, the entire knee inspected.  All other  small chondral loose bodies removed.  No other significant findings  appreciated.  Instruments and fluid removed.  Portals in knee injected with  Marcaine.  Portals closed with 4-0 nylon.  Sterile compressive dressing  applied.  Anesthesia reversed and brought to recovery room.  Tolerated  surgery well, no complications.  Loreta Ave, M.D.    DFM/MEDQ  D:  01/01/2002  T:  01/07/2002  Job:  [id number]

## 2010-11-01 ENCOUNTER — Telehealth: Payer: Self-pay

## 2010-11-01 NOTE — Telephone Encounter (Signed)
Pt called left msg on triage voicemail says she thinks she was bit by something and now gland on neck are swollen would like to know if antibiotic can be called into pharmacy.   Called pt back at phone #'s given informed she will need office visit before antibiotics can be given.

## 2010-11-02 ENCOUNTER — Ambulatory Visit: Payer: BC Managed Care – PPO | Admitting: Family Medicine

## 2011-01-17 ENCOUNTER — Encounter: Payer: Self-pay | Admitting: Family Medicine

## 2011-01-17 ENCOUNTER — Ambulatory Visit (INDEPENDENT_AMBULATORY_CARE_PROVIDER_SITE_OTHER): Payer: BC Managed Care – PPO | Admitting: Family Medicine

## 2011-01-17 DIAGNOSIS — N951 Menopausal and female climacteric states: Secondary | ICD-10-CM | POA: Insufficient documentation

## 2011-01-17 NOTE — Assessment & Plan Note (Signed)
Pt's irregular periods are consistent w/ perimenopause.  No cause for concern based on hx.  If pt has continued heavy bleeding she is to call and we will need to check hgb and get pelvic US.  Pt expressed understanding and is in agreement w/ plan.

## 2011-01-17 NOTE — Patient Instructions (Signed)
This is most likely perimenopause and can take quite awhile to regulate itself There is no stupid question- if you need to, CALL ME! Hang in there!!

## 2011-01-17 NOTE — Progress Notes (Signed)
  Subjective:    Patient ID: Sarah Galvan, female    DOB: 30-Apr-1961, 50 y.o.   MRN: [id number]  HPI Irregular periods- has always been regular, having periods every 28 days.  Missed a month, then had 3 weeks of spotting.  Friday had excessive bleeding- continued until Monday when it slowed considerably.  Today no bleeding.  Friend told her that this is typical of perimenopause.  No other sxs- denies abdominal pain, excessive mood swings, hot flashes.   Review of Systems For ROS see HPI     Objective:   Physical Exam  Vitals reviewed. Constitutional: She appears well-developed and well-nourished. No distress.  Abdominal: Soft. Bowel sounds are normal. She exhibits no distension. There is no tenderness. There is no rebound and no guarding.  Psychiatric: She has a normal mood and affect. Her behavior is normal.          Assessment & Plan:

## 2011-06-15 ENCOUNTER — Ambulatory Visit (INDEPENDENT_AMBULATORY_CARE_PROVIDER_SITE_OTHER): Payer: BC Managed Care – PPO | Admitting: Family Medicine

## 2011-06-15 ENCOUNTER — Encounter: Payer: Self-pay | Admitting: Family Medicine

## 2011-06-15 DIAGNOSIS — R5381 Other malaise: Secondary | ICD-10-CM

## 2011-06-15 DIAGNOSIS — Z0000 Encounter for general adult medical examination without abnormal findings: Secondary | ICD-10-CM

## 2011-06-15 DIAGNOSIS — E559 Vitamin D deficiency, unspecified: Secondary | ICD-10-CM

## 2011-06-15 DIAGNOSIS — R5383 Other fatigue: Secondary | ICD-10-CM

## 2011-06-15 DIAGNOSIS — R490 Dysphonia: Secondary | ICD-10-CM

## 2011-06-15 LAB — LIPID PANEL
HDL: 51.4 mg/dL (ref >39.00)
LDL Cholesterol: 94 mg/dL (ref 0–99)
Triglycerides: 72 mg/dL (ref 0.0–149.0)
VLDL: 14.4 mg/dL (ref 0.0–40.0)

## 2011-06-15 LAB — CBC WITH DIFFERENTIAL/PLATELET
Basophils Absolute: 0.1 10*3/uL (ref 0.0–0.1)
Basophils Relative: 0.8 % (ref 0.0–3.0)
Eosinophils Absolute: 0.1 10*3/uL (ref 0.0–0.7)
Lymphocytes Relative: 21 % (ref 12.0–46.0)
Monocytes Absolute: 0.5 10*3/uL (ref 0.1–1.0)
Neutro Abs: 4.7 10*3/uL (ref 1.4–7.7)
Neutrophils Relative %: 69.5 % (ref 43.0–77.0)
Platelets: 249 10*3/uL (ref 150.0–400.0)
RBC: 4.72 Mil/uL (ref 3.87–5.11)
RDW: 13.5 % (ref 11.5–14.6)
WBC: 6.8 10*3/uL (ref 4.5–10.5)

## 2011-06-15 LAB — BASIC METABOLIC PANEL
BUN: 12 mg/dL (ref 6–23)
CO2: 24 mEq/L (ref 19–32)
Calcium: 8.5 mg/dL (ref 8.4–10.5)
Creatinine, Ser: 0.8 mg/dL (ref 0.4–1.2)
Glucose, Bld: 90 mg/dL (ref 70–99)

## 2011-06-15 LAB — TSH: TSH: 4.04 u[IU]/mL (ref 0.35–5.50)

## 2011-06-15 NOTE — Progress Notes (Signed)
  Subjective:    Patient ID: Sarah Galvan, female    DOB: January 12, 1961, 51 y.o.   MRN: [id number]  HPI Fatigue- sxs started 1 week ago, 'hit me like a freight train'.  'all i want to do is sleep'.  sxs are slowly improving- now able to go to bed at 11 again.  Took online thyroid test and 'checked all but 2 boxes'.  Hoarse- sxs started 12/9.  + PND.  Rare GERD.  Has hx of hoarseness from allergies, feels this is different.  Not painful.  Clearing throat frequently.  No fevers.  No cough.  Has gargled w/ salt water, used nasal spray w/out relief.  Review of Systems For ROS see HPI     Objective:   Physical Exam  Vitals reviewed. Constitutional: She appears well-developed and well-nourished. No distress.  HENT:  Head: Normocephalic and atraumatic.  Right Ear: Tympanic membrane normal.  Left Ear: Tympanic membrane normal.  Nose: Mucosal edema and rhinorrhea present. Right sinus exhibits no maxillary sinus tenderness and no frontal sinus tenderness. Left sinus exhibits no maxillary sinus tenderness and no frontal sinus tenderness.  Mouth/Throat: Mucous membranes are normal. Posterior oropharyngeal erythema (w/ PND) present.  Eyes: Conjunctivae and EOM are normal. Pupils are equal, round, and reactive to light.  Neck: Normal range of motion. Neck supple.  Cardiovascular: Normal rate, regular rhythm and normal heart sounds.   Pulmonary/Chest: Effort normal and breath sounds normal. No respiratory distress. She has no wheezes. She has no rales.  Lymphadenopathy:    She has no cervical adenopathy.          Assessment & Plan:

## 2011-06-15 NOTE — Patient Instructions (Signed)
Schedule your complete physical this spring at your convenience We'll notify you of your lab results Someone will call you with your ENT appt Call with any questions or concerns Hang in there!!!

## 2011-06-20 ENCOUNTER — Encounter: Payer: Self-pay | Admitting: *Deleted

## 2011-06-20 LAB — VITAMIN D 1,25 DIHYDROXY: Vitamin D2 1, 25 (OH)2: <8 pg/mL

## 2011-07-01 NOTE — Assessment & Plan Note (Signed)
Pt w/out obvious PE finding that would account for fatigue.  Check TSH and CBC.  May be viral illness.  Determine next steps based on labs.  Pt expressed understanding and is in agreement w/ plan.

## 2011-07-01 NOTE — Assessment & Plan Note (Signed)
Pt w/ hx of this, may be contributing to current fatigue.  Check labs and replete prn.

## 2011-07-01 NOTE — Assessment & Plan Note (Signed)
Most commonly caused by PND or GERD but pt feels this is different than previous allergy sxs and denies GERD.  Refer to ENT for complete eval and tx.

## 2011-08-03 ENCOUNTER — Encounter: Payer: Self-pay | Admitting: Family Medicine

## 2011-08-03 ENCOUNTER — Ambulatory Visit (INDEPENDENT_AMBULATORY_CARE_PROVIDER_SITE_OTHER): Payer: BC Managed Care – PPO | Admitting: Family Medicine

## 2011-08-03 VITALS — BP 130/72 | HR 62 | Temp 98.2°F | Ht 62.5 in | Wt 201.4 lb

## 2011-08-03 DIAGNOSIS — J019 Acute sinusitis, unspecified: Secondary | ICD-10-CM

## 2011-08-03 MED ORDER — AMOXICILLIN 875 MG PO TABS: 875.0000 mg | ORAL_TABLET | Freq: Two times a day (BID) | ORAL | Status: DC

## 2011-08-03 NOTE — Progress Notes (Signed)
  Subjective:    Patient ID: Sarah Galvan, female    DOB: 1961-01-16, 51 y.o.   MRN: [id number]  HPI Sinusitis- sxs started 2 weeks ago.  Took Advil Cold and sinus last week w/ some relief and sxs worsened again this week.  No drainage.  + facial pain.  R ear fullness.  No fevers.  + sick contacts.   Review of Systems For ROS see HPI     Objective:   Physical Exam  Vitals reviewed. Constitutional: She appears well-developed and well-nourished. No distress.  HENT:  Head: Normocephalic and atraumatic.  Right Ear: Tympanic membrane normal.  Left Ear: Tympanic membrane normal.  Nose: Mucosal edema and rhinorrhea present. Right sinus exhibits frontal sinus tenderness. Right sinus exhibits no maxillary sinus tenderness. Left sinus exhibits frontal sinus tenderness. Left sinus exhibits no maxillary sinus tenderness.  Mouth/Throat: Uvula is midline and mucous membranes are normal. Posterior oropharyngeal erythema present. No oropharyngeal exudate.  Eyes: Conjunctivae and EOM are normal. Pupils are equal, round, and reactive to light.  Neck: Normal range of motion. Neck supple.  Cardiovascular: Normal rate, regular rhythm and normal heart sounds.   Pulmonary/Chest: Effort normal and breath sounds normal. No respiratory distress. She has no wheezes.  Lymphadenopathy:    She has no cervical adenopathy.          Assessment & Plan:

## 2011-08-03 NOTE — Assessment & Plan Note (Signed)
Pt's sxs and PE consistent w/ infxn.  Start abx.  Reviewed supportive care and red flags that should prompt return.  Pt expressed understanding and is in agreement w/ plan.  

## 2011-08-03 NOTE — Patient Instructions (Signed)
This is a sinus infection Start the Amox twice daily w/ food Drink plenty of fluids Add Mucinex to thin your congestion REST! Hang in there!!!

## 2012-01-10 ENCOUNTER — Telehealth: Payer: Self-pay | Admitting: Family Medicine

## 2012-01-10 ENCOUNTER — Encounter: Payer: Self-pay | Admitting: Family Medicine

## 2012-01-10 ENCOUNTER — Ambulatory Visit (INDEPENDENT_AMBULATORY_CARE_PROVIDER_SITE_OTHER): Payer: BC Managed Care – PPO | Admitting: Family Medicine

## 2012-01-10 VITALS — BP 106/72 | HR 53 | Temp 97.8°F | Wt 188.0 lb

## 2012-01-10 DIAGNOSIS — H1010 Acute atopic conjunctivitis, unspecified eye: Secondary | ICD-10-CM

## 2012-01-10 DIAGNOSIS — H1045 Other chronic allergic conjunctivitis: Secondary | ICD-10-CM

## 2012-01-10 MED ORDER — AZELASTINE HCL 0.05 % OP SOLN: 1.0000 [drp] | Freq: Two times a day (BID) | OPHTHALMIC | Status: DC

## 2012-01-10 NOTE — Telephone Encounter (Signed)
Opened in error

## 2012-01-10 NOTE — Patient Instructions (Addendum)

## 2012-01-10 NOTE — Telephone Encounter (Signed)
Caller: Sarah Galvan/Patient; PCP: Sheliah Hatch.; CB#: [phone]; ; ; Call regarding Eye Itching, Sore Lt Eye; Has had one eye watery and irritated for the last 2 days, has feeling like something in eye.  Some clear stringy discharge.  No injury.  Has tried to treat it with eye drops but not helping.   Triaged in Eye Irritation Guideline - Disposition:  See Provider Within 24 Hours due to new onset of eye irritation, gritty feeling with watery sticky mucus drainage.  Appt 2:30 pm today Dr Laury Axon.

## 2012-01-10 NOTE — Telephone Encounter (Signed)
FYI

## 2012-01-10 NOTE — Progress Notes (Signed)
  Subjective:    Sarah Galvan is a 51 y.o. female who presents for evaluation of erythema, itching and tearing in both eyes. She has noticed the above symptoms for several days. Onset was gradual. Patient denies blurred vision, discharge, foreign body sensation and photophobia. There is a history of allergies.  The following portions of the patient's history were reviewed and updated as appropriate: allergies, current medications, past family history, past medical history, past social history, past surgical history and problem list.  Review of Systems Pertinent items are noted in HPI.   Objective:    BP 106/72  Pulse 53  Temp 97.8 F (36.6 C) (Oral)  Wt 188 lb (85.276 kg)  SpO2 97%      General: alert, cooperative, appears stated age and no distress  Eyes:  positive findings: sclera injected,  no d/c  Vision: Not performed  Fluorescein:  not done     Assessment:    Acute conjunctivitis and Allergic conjunctivitis   Plan:    Ophthalmic drops per orders. Warm compress to eye(s).  F/u prn

## 2012-04-14 ENCOUNTER — Ambulatory Visit (INDEPENDENT_AMBULATORY_CARE_PROVIDER_SITE_OTHER): Payer: BC Managed Care – PPO | Admitting: Family Medicine

## 2012-04-14 ENCOUNTER — Encounter: Payer: Self-pay | Admitting: Family Medicine

## 2012-04-14 VITALS — BP 118/76 | HR 72 | Temp 98.7°F | Wt 189.0 lb

## 2012-04-14 DIAGNOSIS — J019 Acute sinusitis, unspecified: Secondary | ICD-10-CM

## 2012-04-14 MED ORDER — GUAIFENESIN-CODEINE 100-10 MG/5ML PO SYRP: 5.0000 mL | ORAL_SOLUTION | ORAL | Status: DC

## 2012-04-14 MED ORDER — AMOXICILLIN 875 MG PO TABS: 875.0000 mg | ORAL_TABLET | Freq: Two times a day (BID) | ORAL | Status: DC

## 2012-04-14 NOTE — Patient Instructions (Addendum)
This is a sinus infection Start the Amoxicillin twice daily- take w/ food Use the cough syrup as needed- may make you sleepy Mucinex DM for daytime use REST! Plenty of fluids Hang in there!!!

## 2012-04-14 NOTE — Progress Notes (Signed)
  Subjective:    Patient ID: Sarah Galvan, female    DOB: Sep 30, 1960, 51 y.o.   MRN: [id number]  HPI URI- sxs started 5 days ago w/ sinus pressure and ear fullness.  Took mucinex d w/out relief.  Fever started yesterday.  Now having chest congestion, productive cough.  Tm 101 last night.  + sick contacts.   Review of Systems For ROS see HPI     Objective:   Physical Exam  Vitals reviewed. Constitutional: She appears well-developed and well-nourished. No distress.  HENT:  Head: Normocephalic and atraumatic.  Right Ear: Tympanic membrane normal.  Left Ear: Tympanic membrane normal.  Nose: Mucosal edema and rhinorrhea present. Right sinus exhibits frontal sinus tenderness. Right sinus exhibits no maxillary sinus tenderness. Left sinus exhibits frontal sinus tenderness. Left sinus exhibits no maxillary sinus tenderness.  Mouth/Throat: Uvula is midline and mucous membranes are normal. Posterior oropharyngeal erythema present. No oropharyngeal exudate.  Eyes: Conjunctivae normal and EOM are normal. Pupils are equal, round, and reactive to light.  Neck: Normal range of motion. Neck supple.  Cardiovascular: Normal rate, regular rhythm and normal heart sounds.   Pulmonary/Chest: Effort normal and breath sounds normal. No respiratory distress. She has no wheezes.  Lymphadenopathy:    She has no cervical adenopathy.          Assessment & Plan:

## 2012-04-14 NOTE — Assessment & Plan Note (Signed)
Pt's sxs and PE consistent w/ infxn.  Start abx.  Reviewed supportive care and red flags that should prompt return.  Pt expressed understanding and is in agreement w/ plan.  

## 2012-04-29 ENCOUNTER — Ambulatory Visit (INDEPENDENT_AMBULATORY_CARE_PROVIDER_SITE_OTHER): Payer: BC Managed Care – PPO | Admitting: Family Medicine

## 2012-04-29 ENCOUNTER — Encounter: Payer: Self-pay | Admitting: Family Medicine

## 2012-04-29 VITALS — BP 110/62 | HR 60 | Temp 98.1°F | Wt 191.0 lb

## 2012-04-29 DIAGNOSIS — J209 Acute bronchitis, unspecified: Secondary | ICD-10-CM

## 2012-04-29 DIAGNOSIS — J449 Chronic obstructive pulmonary disease, unspecified: Secondary | ICD-10-CM | POA: Insufficient documentation

## 2012-04-29 MED ORDER — ALBUTEROL SULFATE (5 MG/ML) 0.5% IN NEBU
2.5000 mg | INHALATION_SOLUTION | Freq: Once | RESPIRATORY_TRACT | Status: AC
  Administered 2012-04-29: 2.5 mg via RESPIRATORY_TRACT

## 2012-04-29 MED ORDER — PREDNISONE 10 MG PO TABS: ORAL_TABLET | ORAL | Status: DC

## 2012-04-29 MED ORDER — AZITHROMYCIN 250 MG PO TABS: ORAL_TABLET | ORAL | Status: DC

## 2012-04-29 MED ORDER — IPRATROPIUM BROMIDE 0.02 % IN SOLN
0.5000 mg | Freq: Once | RESPIRATORY_TRACT | Status: AC
  Administered 2012-04-29: 0.5 mg via RESPIRATORY_TRACT

## 2012-04-29 NOTE — Patient Instructions (Addendum)
Use the albuterol inhaler- 2 puffs as needed for shortness of breath, wheezing, or cough If your symptoms change or worsen, start the steroids to open your airway Continue the cough syrup w/ codeine at night and Mucinex DM for daytime cough Drink plenty of fluids REST! Happy Holidays!

## 2012-04-29 NOTE — Progress Notes (Signed)
  Subjective:    Patient ID: Sarah Galvan, female    DOB: 05-18-1961, 51 y.o.   MRN: [id number]  HPI URI- pt seen on 11/11 w/ sinus infxn.  Sinuses have improved on Amox but now sxs have settled in chest.  + wheezing, SOB, coughing fits, hoarseness.  No fevers.  Cough is intermittently productive.  No hx of asthma.   Review of Systems For ROS see HPI     Objective:   Physical Exam  Constitutional: She is oriented to person, place, and time. She appears well-developed and well-nourished. No distress.  HENT:  Head: Normocephalic and atraumatic.       TMs normal bilaterally Mild nasal congestion Throat w/out erythema, edema, or exudate  Eyes: Conjunctivae normal and EOM are normal. Pupils are equal, round, and reactive to light.  Neck: Normal range of motion. Neck supple.  Cardiovascular: Normal rate, regular rhythm, normal heart sounds and intact distal pulses.   No murmur heard. Pulmonary/Chest: Effort normal. No respiratory distress. She has wheezes (diffuse expiratory wheezes w/ prolonged expiratory phase.  improved s/p neb tx).       + hacking cough  Lymphadenopathy:    She has no cervical adenopathy.  Neurological: She is alert and oriented to person, place, and time.          Assessment & Plan:

## 2012-05-03 NOTE — Assessment & Plan Note (Signed)
New.  Pt's wheezing and chest congestion improved s/p neb tx.  Switch abx.  Start albuterol HFA prn.  Prescription given for steroids for pt to fill if no improvement or wheezing worsens.  Reviewed supportive care and red flags that should prompt return.  Pt expressed understanding and is in agreement w/ plan.

## 2012-06-09 ENCOUNTER — Ambulatory Visit (INDEPENDENT_AMBULATORY_CARE_PROVIDER_SITE_OTHER): Payer: BC Managed Care – PPO | Admitting: Family Medicine

## 2012-06-09 ENCOUNTER — Encounter: Payer: Self-pay | Admitting: Family Medicine

## 2012-06-09 VITALS — BP 130/70 | HR 68 | Temp 98.1°F | Ht 62.5 in | Wt 192.8 lb

## 2012-06-09 DIAGNOSIS — R05 Cough: Secondary | ICD-10-CM

## 2012-06-09 MED ORDER — GUAIFENESIN-CODEINE 100-10 MG/5ML PO SYRP: 5.0000 mL | ORAL_SOLUTION | ORAL | Status: DC

## 2012-06-09 MED ORDER — ALBUTEROL SULFATE HFA 108 (90 BASE) MCG/ACT IN AERS: 2.0000 | INHALATION_SPRAY | Freq: Four times a day (QID) | RESPIRATORY_TRACT | Status: DC | PRN

## 2012-06-09 MED ORDER — PREDNISONE 10 MG PO TABS: ORAL_TABLET | ORAL | Status: DC

## 2012-06-09 NOTE — Progress Notes (Signed)
  Subjective:    Patient ID: Sarah Galvan, female    DOB: [birth year], 51 y.o.   MRN: [id number]  HPI URI- 6 days ago developed fever, body aches, cough.  Tm 101.  Pt feels like she had the flu.  Friday body aches improved but cough acutely worsened.  + dry hacking cough, chest congestion.  + wheezing.  Using albuterol w/ good results to stop coughing fits.  No longer having fevers.   Review of Systems For ROS see HPI     Objective:   Physical Exam  Vitals reviewed. Constitutional: She appears well-developed and well-nourished. No distress.  HENT:  Head: Normocephalic and atraumatic.       TMs normal bilaterally Mild nasal congestion Throat w/out erythema, edema, or exudate  Eyes: Conjunctivae normal and EOM are normal. Pupils are equal, round, and reactive to light.  Neck: Normal range of motion. Neck supple.  Cardiovascular: Normal rate, regular rhythm, normal heart sounds and intact distal pulses.   No murmur heard. Pulmonary/Chest: Effort normal and breath sounds normal. No respiratory distress. She has no wheezes.       + hacking cough  Lymphadenopathy:    She has no cervical adenopathy.          Assessment & Plan:

## 2012-06-09 NOTE — Patient Instructions (Addendum)
Start the Prednisone as directed- take w/ food Use the inhaler as needed for cough/wheezing/shortness of breath Cough syrup as needed Mucinex to thin your chest congestion REST! Hang in there!

## 2012-06-10 NOTE — Assessment & Plan Note (Signed)
New.  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  Suspect she is having airway inflammation- similar to cough variant asthma.  Start steroids, continue albuterol inhaler, cough meds prn.  Reviewed supportive care and red flags that should prompt return.  Pt expressed understanding and is in agreement w/ plan.

## 2012-07-19 ENCOUNTER — Other Ambulatory Visit: Payer: Self-pay

## 2012-07-23 ENCOUNTER — Encounter: Payer: BC Managed Care – PPO | Admitting: Family Medicine

## 2012-08-27 ENCOUNTER — Encounter: Payer: Self-pay | Admitting: Family Medicine

## 2012-08-27 ENCOUNTER — Ambulatory Visit (INDEPENDENT_AMBULATORY_CARE_PROVIDER_SITE_OTHER): Payer: BC Managed Care – PPO | Admitting: Family Medicine

## 2012-08-27 VITALS — BP 102/70 | HR 64 | Temp 97.9°F | Ht 63.5 in | Wt 192.0 lb

## 2012-08-27 DIAGNOSIS — Z1231 Encounter for screening mammogram for malignant neoplasm of breast: Secondary | ICD-10-CM

## 2012-08-27 DIAGNOSIS — Z0000 Encounter for general adult medical examination without abnormal findings: Secondary | ICD-10-CM

## 2012-08-27 DIAGNOSIS — Z1211 Encounter for screening for malignant neoplasm of colon: Secondary | ICD-10-CM

## 2012-08-27 DIAGNOSIS — Z1331 Encounter for screening for depression: Secondary | ICD-10-CM

## 2012-08-27 LAB — CBC WITH DIFFERENTIAL/PLATELET
Basophils Relative: 0.6 % (ref 0.0–3.0)
Eosinophils Absolute: 0.2 10*3/uL (ref 0.0–0.7)
HCT: 42.4 % (ref 36.0–46.0)
Lymphocytes Relative: 19.7 % (ref 12.0–46.0)
Lymphs Abs: 1.4 10*3/uL (ref 0.7–4.0)
MCHC: 33.8 g/dL (ref 30.0–36.0)
Monocytes Absolute: 0.5 10*3/uL (ref 0.1–1.0)
Platelets: 256 10*3/uL (ref 150.0–400.0)
WBC: 7.2 10*3/uL (ref 4.5–10.5)

## 2012-08-27 LAB — BASIC METABOLIC PANEL
GFR: 62.78 mL/min (ref >60.00)
Potassium: 4.4 mEq/L (ref 3.5–5.1)
Sodium: 137 mEq/L (ref 135–145)

## 2012-08-27 LAB — LIPID PANEL
HDL: 50 mg/dL (ref >39.00)
Total CHOL/HDL Ratio: 3
Triglycerides: 62 mg/dL (ref 0.0–149.0)
VLDL: 12.4 mg/dL (ref 0.0–40.0)

## 2012-08-27 LAB — HEPATIC FUNCTION PANEL
AST: 17 U/L (ref 0–37)
Alkaline Phosphatase: 59 U/L (ref 39–117)
Bilirubin, Direct: 0.1 mg/dL (ref 0.0–0.3)
Total Bilirubin: 1.1 mg/dL (ref 0.3–1.2)

## 2012-08-27 NOTE — Progress Notes (Signed)
  Subjective:    Patient ID: Sarah Galvan, female    DOB: 1961-06-02, 52 y.o.   MRN: [id number]  HPI CPE- overdue on mammo ([facility]), has never had colonoscopy.  No pap today due to menses.   Review of Systems Patient reports no vision/ hearing changes, adenopathy,fever, weight change,  persistant/recurrent hoarseness, swallowing issues, chest pain, palpitations, edema, persistant/recurrent cough, hemoptysis, dyspnea (rest/exertional/paroxysmal nocturnal), gastrointestinal bleeding (melena, rectal bleeding), abdominal pain, significant heartburn, bowel changes, GU symptoms (dysuria, hematuria, incontinence), Gyn symptoms (abnormal  bleeding, pain),  syncope, focal weakness, memory loss, numbness & tingling, skin/hair/nail changes, abnormal bruising or bleeding, anxiety, or depression.     Objective:   Physical Exam  General Appearance:    Alert, cooperative, no distress, appears stated age  Head:    Normocephalic, without obvious abnormality, atraumatic  Eyes:    PERRL, conjunctiva/corneas clear, EOM's intact, fundi    benign, both eyes  Ears:    Normal TM's and external ear canals, both ears  Nose:   Nares normal, septum midline, mucosa normal, no drainage    or sinus tenderness  Throat:   Lips, mucosa, and tongue normal; teeth and gums normal  Neck:   Supple, symmetrical, trachea midline, no adenopathy;    Thyroid: no enlargement/tenderness/nodules  Back:     Symmetric, no curvature, ROM normal, no CVA tenderness  Lungs:     Clear to auscultation bilaterally, respirations unlabored  Chest Wall:    No tenderness or deformity   Heart:    Regular rate and rhythm, S1 and S2 normal, no murmur, rub   or gallop  Breast Exam:    No tenderness, masses, or nipple abnormality  Abdomen:     Soft, non-tender, bowel sounds active all four quadrants,    no masses, no organomegaly  Genitalia:    Deferred to menses  Rectal:    Extremities:   Extremities normal, atraumatic, no cyanosis or  edema  Pulses:   2+ and symmetric all extremities  Skin:   Skin color, texture, turgor normal, no rashes or lesions  Lymph nodes:   Cervical, supraclavicular, and axillary nodes normal  Neurologic:   CNII-XII intact, normal strength, sensation and reflexes    throughout          Assessment & Plan:

## 2012-08-27 NOTE — Patient Instructions (Signed)
Follow up in 1 year or as needed Keep up the good work!  You look great! Get your mammo!! Someone will call you with your GI appt We'll notify you of your lab results and make any changes if needed Call with any questions or concerns Happy Spring!

## 2012-08-27 NOTE — Assessment & Plan Note (Signed)
Pt's PE WNL.  Overdue on mammo and needs colonoscopy- referrals made for each.  Check labs.  EKG done- see document for interpretation.  Anticipatory guidance provided.

## 2012-08-28 ENCOUNTER — Telehealth: Payer: Self-pay | Admitting: *Deleted

## 2012-08-28 DIAGNOSIS — E039 Hypothyroidism, unspecified: Secondary | ICD-10-CM

## 2012-08-28 MED ORDER — LEVOTHYROXINE SODIUM 50 MCG PO TABS: 50.0000 ug | ORAL_TABLET | Freq: Every day | ORAL | Status: DC

## 2012-08-28 NOTE — Telephone Encounter (Signed)
Message copied by Verdie Shire on Thu Aug 28, 2012  3:53 PM ------      Message from: Sheliah Hatch      Created: Wed Aug 27, 2012  2:58 PM       TSH is at high end of normal and has been high previously.  Would recommend starting Synthroid 50 mcg daily and repeating labs (TSH) in 3 months ------

## 2012-08-28 NOTE — Telephone Encounter (Signed)
Spoke with the pt and informed her of recent lab results and note.  Pt understood and agreed to start new med.  Pt stated that she will call back to schedule an lab appt.for repeat TSH, and a future order was put in for the TSH.  New rx was sent to the pharmacy by e-script.//AB/CMA

## 2012-08-30 LAB — VITAMIN D 1,25 DIHYDROXY: Vitamin D 1, 25 (OH)2 Total: 36 pg/mL (ref 18–72)

## 2012-09-01 ENCOUNTER — Encounter: Payer: Self-pay | Admitting: Internal Medicine

## 2012-10-03 ENCOUNTER — Encounter (HOSPITAL_BASED_OUTPATIENT_CLINIC_OR_DEPARTMENT_OTHER): Payer: Self-pay | Admitting: *Deleted

## 2012-10-03 NOTE — Progress Notes (Signed)
No labs needed-denies any slepp apnea-had bronchitis last winter-no problems now

## 2012-10-08 NOTE — H&P (Signed)
Ivon Oelkers/[facility] [address]   [address] [place], [place] [address] [phone] A Division of [facility]  Loreta Ave, M.D.   Robert A. Thurston Hole, M.D.   Burnell Blanks, M.D.   Eulas Post, M.D.   Lunette Stands, M.D Buford Dresser, M.D.  Charlsie Quest, M.D.   Estell Harpin, M.D.   Melina Fiddler, M.D. Genene Churn. Barry Dienes, PA-C            Kirstin A. Shepperson, PA-C Josh Warren, PA-C [place], [place]   RE: [place], [place]   [phone]      DOB: Feb 26, 1961 PROGRESS NOTE: 09-23-12 Chief complaint: right shoulder pain. History of present illness: 51 year old white female presents with right shoulder pain. States a month ago she slipped on ice falling onto an outstretched right arm. Immediate right shoulder pain aggravated with overhead activity and reaching behind her back. She treated with sling and rest. She is a Therapist, occupational and states she softly underhand tossed a ball and felt sharp pain anterior aspect of her shoulder. She feels like her shoulder is unstable. When she sits on a couch she feels like her shoulder subluxes anteriorly. Denies prior shoulder problems.  No cervical spine or radicular component no numbness or tingling.  Current medications: Synthroid. No known drug allergies. Past medical/surgical history: hyperthyroid cholecystectomy 5 knee surgeries. Family history: positive hypertension arthritis. Social history: she is married employed as a Forensic scientist with [employer]  Denies smoking or alcohol use. Review of systems: unremarkable   EXAMINATION: Height 5'2" weight 185 pounds. Blood pressure 110/70. Alert and oriented x3 in no acute distress. Cervical spine unremarkable. Right shoulder has good range of motion with discomfort. Positive impingement. Positive cross body adduction test. Mild to moderate tenderness over entire right clavicle. Pain and weakness with supraspinatus  resistance. Positive apprehension test. Negative drop arm. Neurovascularly intact. Skin warm and dry. No increase in respiratory effort. Left shoulder unremarkable.  X-RAYS: AP outlet x-rays show type II acromion with AC degenerative changes. She has an irregular area inferior glenoid and humeral head somewhat suspicious for possible impaction injury. She appears to have non-displaced spiral fracture of the clavicle.   IMPRESSION: Right shoulder pain and instability question Bankart tear and cuff tear. Possible clavicle spiral fracture non-displaced.  PLAN: Sling for comfort. No aggressive activity. Will get MRI arthrogram and she will follow-up after completion to discuss results. All questions answered.  Loreta Ave, M.D.  Electronically verified by Loreta Ave, M.D. DFM(JMO):[id number]  Crist[facility] Bicknell/[facility] [address]   [address] [place], [place] [address] [phone] A Division of [facility]  Loreta Ave, M.D.   Robert A. Thurston Hole, M.D.   Burnell Blanks, M.D.   Eulas Post, M.D.   Lunette Stands, M.D Buford Dresser, M.D.  Charlsie Quest, M.D.   Estell Harpin, M.D.   Melina Fiddler, M.D. Genene Churn. Barry Dienes, PA-C            Kirstin A. Shepperson, PA-C Josh [place], PA-C [place], [place]   RE: [place], [place]                                [phone]      DOB: June 07, 1960 PROGRESS NOTE: 09-30-12 [age] year-old white female with a history of right shoulder pain.  Returns for review of  her MR arthrogram that was performed on September 24, 2012.  Scan showed extensive anterior labral tear.  Labrum was torn and stripped superior to inferior.  Articular cartilage defect (GLAD lesion).  Posterior labral tear superiorly.  Superior labrum is intact and long head biceps tendon and glenohumeral ligaments are intact.  Moderate rotator cuff tendinopathy/tendinosis and small linear articular surface tear involving the  supraspinatus tendon.  Type II-III acromion and mild AC degenerative changes.  States that shoulder pain is unchanged from previous visit.  Patient advised that the only viable option for her shoulder at this point is right shoulder arthroscopy with debridement, subacromial decompression, DCE and possible arthroscopically assisted rotator cuff repair and Bankart repair.  Surgical procedure, along with potential rehab/recovery time discussed.  With repair immobilizer until six weeks post-op.  Would recommend that this be done sometime in the near future and do not put it off too long.  All questions answered.  Pre-op paperwork filled out.    Loreta Ave, M.D.   Electronically verified by Loreta Ave, M.D. DFM(JMO):jjh D 10-01-12 T 10-02-12

## 2012-10-09 ENCOUNTER — Ambulatory Visit (HOSPITAL_BASED_OUTPATIENT_CLINIC_OR_DEPARTMENT_OTHER): Payer: BC Managed Care – PPO | Admitting: Anesthesiology

## 2012-10-09 ENCOUNTER — Ambulatory Visit (HOSPITAL_BASED_OUTPATIENT_CLINIC_OR_DEPARTMENT_OTHER)
Admission: RE | Admit: 2012-10-09 | Discharge: 2012-10-09 | Disposition: A | Discharge status: Home / Self Care | Payer: BC Managed Care – PPO | Source: Ambulatory Visit | Attending: Orthopedic Surgery | Admitting: Orthopedic Surgery

## 2012-10-09 ENCOUNTER — Encounter (HOSPITAL_BASED_OUTPATIENT_CLINIC_OR_DEPARTMENT_OTHER): Payer: Self-pay | Admitting: *Deleted

## 2012-10-09 ENCOUNTER — Encounter (HOSPITAL_BASED_OUTPATIENT_CLINIC_OR_DEPARTMENT_OTHER)
Admission: RE | Disposition: A | Discharge status: Home / Self Care | Payer: Self-pay | Source: Ambulatory Visit | Attending: Orthopedic Surgery

## 2012-10-09 ENCOUNTER — Encounter (HOSPITAL_BASED_OUTPATIENT_CLINIC_OR_DEPARTMENT_OTHER): Payer: Self-pay | Admitting: Anesthesiology

## 2012-10-09 DIAGNOSIS — Z9889 Other specified postprocedural states: Secondary | ICD-10-CM

## 2012-10-09 DIAGNOSIS — M67919 Unspecified disorder of synovium and tendon, unspecified shoulder: Secondary | ICD-10-CM | POA: Insufficient documentation

## 2012-10-09 DIAGNOSIS — M25819 Other specified joint disorders, unspecified shoulder: Secondary | ICD-10-CM | POA: Insufficient documentation

## 2012-10-09 DIAGNOSIS — E0590 Thyrotoxicosis, unspecified without thyrotoxic crisis or storm: Secondary | ICD-10-CM | POA: Insufficient documentation

## 2012-10-09 DIAGNOSIS — M19019 Primary osteoarthritis, unspecified shoulder: Secondary | ICD-10-CM | POA: Insufficient documentation

## 2012-10-09 DIAGNOSIS — M24419 Recurrent dislocation, unspecified shoulder: Secondary | ICD-10-CM | POA: Insufficient documentation

## 2012-10-09 DIAGNOSIS — S43429A Sprain of unspecified rotator cuff capsule, initial encounter: Secondary | ICD-10-CM | POA: Insufficient documentation

## 2012-10-09 DIAGNOSIS — W010XXA Fall on same level from slipping, tripping and stumbling without subsequent striking against object, initial encounter: Secondary | ICD-10-CM | POA: Insufficient documentation

## 2012-10-09 DIAGNOSIS — M719 Bursopathy, unspecified: Secondary | ICD-10-CM | POA: Insufficient documentation

## 2012-10-09 HISTORY — DX: Other seasonal allergic rhinitis: J30.2

## 2012-10-09 HISTORY — PX: SHOULDER ARTHROSCOPY WITH BANKART REPAIR: SHX5673

## 2012-10-09 HISTORY — DX: Bronchitis, not specified as acute or chronic: J40

## 2012-10-09 SURGERY — SHOULDER ARTHROSCOPY WITH BANKART REPAIR
Anesthesia: General | Site: Shoulder | Laterality: Right | Wound class: Clean

## 2012-10-09 MED ORDER — ONDANSETRON HCL 4 MG/2ML IJ SOLN
INTRAMUSCULAR | Status: DC | PRN
  Administered 2012-10-09: 4 mg via INTRAVENOUS

## 2012-10-09 MED ORDER — SUCCINYLCHOLINE CHLORIDE 20 MG/ML IJ SOLN
INTRAMUSCULAR | Status: DC | PRN
  Administered 2012-10-09: 100 mg via INTRAVENOUS

## 2012-10-09 MED ORDER — LIDOCAINE HCL (CARDIAC) 20 MG/ML IV SOLN
INTRAVENOUS | Status: DC | PRN
  Administered 2012-10-09: 70 mg via INTRAVENOUS

## 2012-10-09 MED ORDER — LACTATED RINGERS IV SOLN
INTRAVENOUS | Status: DC
  Administered 2012-10-09 (×2): via INTRAVENOUS

## 2012-10-09 MED ORDER — OXYCODONE HCL 5 MG PO TABS: 5.0000 mg | ORAL_TABLET | Freq: Once | ORAL | Status: DC | PRN

## 2012-10-09 MED ORDER — DEXAMETHASONE SODIUM PHOSPHATE 4 MG/ML IJ SOLN
INTRAMUSCULAR | Status: DC | PRN
  Administered 2012-10-09: 10 mg via INTRAVENOUS

## 2012-10-09 MED ORDER — MIDAZOLAM HCL 2 MG/2ML IJ SOLN
1.0000 mg | INTRAMUSCULAR | Status: DC | PRN
  Administered 2012-10-09: 2 mg via INTRAVENOUS

## 2012-10-09 MED ORDER — ONDANSETRON HCL 4 MG/2ML IJ SOLN: 4.0000 mg | Freq: Once | INTRAMUSCULAR | Status: DC | PRN

## 2012-10-09 MED ORDER — HYDROMORPHONE HCL PF 1 MG/ML IJ SOLN: 0.2500 mg | INTRAMUSCULAR | Status: DC | PRN

## 2012-10-09 MED ORDER — PROPOFOL 10 MG/ML IV BOLUS
INTRAVENOUS | Status: DC | PRN
  Administered 2012-10-09: 200 mg via INTRAVENOUS

## 2012-10-09 MED ORDER — FENTANYL CITRATE 0.05 MG/ML IJ SOLN
50.0000 ug | Freq: Once | INTRAMUSCULAR | Status: AC
  Administered 2012-10-09: 100 ug via INTRAVENOUS

## 2012-10-09 MED ORDER — OXYCODONE-ACETAMINOPHEN 5-325 MG PO TABS: 1.0000 | ORAL_TABLET | ORAL | Status: DC | PRN

## 2012-10-09 MED ORDER — LIDOCAINE HCL 4 % MT SOLN
OROMUCOSAL | Status: DC | PRN
  Administered 2012-10-09: 4 mL via TOPICAL

## 2012-10-09 MED ORDER — CEFAZOLIN SODIUM-DEXTROSE 2-3 GM-% IV SOLR
2.0000 g | INTRAVENOUS | Status: AC
  Administered 2012-10-09: 2 g via INTRAVENOUS

## 2012-10-09 MED ORDER — BUPIVACAINE-EPINEPHRINE PF 0.5-1:200000 % IJ SOLN
INTRAMUSCULAR | Status: DC | PRN
  Administered 2012-10-09: 25 mL

## 2012-10-09 MED ORDER — OXYCODONE HCL 5 MG/5ML PO SOLN: 5.0000 mg | Freq: Once | ORAL | Status: DC | PRN

## 2012-10-09 MED ORDER — SODIUM CHLORIDE 0.9 % IR SOLN
Status: DC | PRN
  Administered 2012-10-09: 12000 mL

## 2012-10-09 SURGICAL SUPPLY — 80 items
ANCH SUT PUSHLCK 15.5X2.9 (Orthopedic Implant) ×4 IMPLANT
ANCHOR PUSHLOCK BIOCOMP 2.9X15 (Orthopedic Implant) ×4 IMPLANT
APL SKNCLS STERI-STRIP NONHPOA (GAUZE/BANDAGES/DRESSINGS)
AR1923BC ×1 IMPLANT
BENZOIN TINCTURE PRP APPL 2/3 (GAUZE/BANDAGES/DRESSINGS) IMPLANT
BLADE CUTTER GATOR 3.5 (BLADE) ×2 IMPLANT
BLADE CUTTER MENIS 5.5 (BLADE) IMPLANT
BLADE GREAT WHITE 4.2 (BLADE) ×2 IMPLANT
BLADE SURG 15 STRL LF DISP TIS (BLADE) IMPLANT
BLADE SURG 15 STRL SS (BLADE)
BUR OVAL 6.0 (BURR) ×2 IMPLANT
CANISTER OMNI JUG 16 LITER (MISCELLANEOUS) ×2 IMPLANT
CANISTER SUCTION 2500CC (MISCELLANEOUS) IMPLANT
CANNULA DRY DOC 8X75 (CANNULA) IMPLANT
CANNULA TWIST IN 8.25X7CM (CANNULA) ×1 IMPLANT
CLOTH BEACON ORANGE TIMEOUT ST (SAFETY) ×2 IMPLANT
DECANTER SPIKE VIAL GLASS SM (MISCELLANEOUS) IMPLANT
DRAPE OEC MINIVIEW 54X84 (DRAPES) IMPLANT
DRAPE STERI 35X30 U-POUCH (DRAPES) ×2 IMPLANT
DRAPE U-SHAPE 47X51 STRL (DRAPES) ×2 IMPLANT
DRAPE U-SHAPE 76X120 STRL (DRAPES) ×4 IMPLANT
DRSG PAD ABDOMINAL 8X10 ST (GAUZE/BANDAGES/DRESSINGS) ×2 IMPLANT
DURAPREP 26ML APPLICATOR (WOUND CARE) ×2 IMPLANT
ELECT MENISCUS 165MM 90D (ELECTRODE) ×2 IMPLANT
ELECT NDL TIP 2.8 STRL (NEEDLE) IMPLANT
ELECT NEEDLE TIP 2.8 STRL (NEEDLE) IMPLANT
ELECT REM PT RETURN 9FT ADLT (ELECTROSURGICAL) ×2
ELECTRODE REM PT RTRN 9FT ADLT (ELECTROSURGICAL) ×1 IMPLANT
GAUZE XEROFORM 1X8 LF (GAUZE/BANDAGES/DRESSINGS) ×2 IMPLANT
GLOVE BIO SURGEON STRL SZ 6.5 (GLOVE) ×1 IMPLANT
GLOVE BIOGEL PI IND STRL 7.0 (GLOVE) ×1 IMPLANT
GLOVE BIOGEL PI IND STRL 8 (GLOVE) ×1 IMPLANT
GLOVE BIOGEL PI INDICATOR 7.0 (GLOVE) ×1
GLOVE BIOGEL PI INDICATOR 8 (GLOVE) ×1
GLOVE EXAM NITRILE MD LF STRL (GLOVE) ×2 IMPLANT
GLOVE ORTHO TXT STRL SZ7.5 (GLOVE) ×4 IMPLANT
GOWN PREVENTION PLUS XLARGE (GOWN DISPOSABLE) ×3 IMPLANT
GOWN STRL REIN 2XL XLG LVL4 (GOWN DISPOSABLE) ×2 IMPLANT
IMMOBILIZER SHOULDER XLGE (ORTHOPEDIC SUPPLIES) ×1 IMPLANT
IV NS IRRIG 3000ML ARTHROMATIC (IV SOLUTION) ×8 IMPLANT
KIT PUSHLOCK 2.9 HIP (KITS) ×2 IMPLANT
LASSO 90 CVE QUICKPAS (DISPOSABLE) ×1 IMPLANT
NDL SCORPION MULTI FIRE (NEEDLE) IMPLANT
NDL SUT 6 .5 CRC .975X.05 MAYO (NEEDLE) IMPLANT
NEEDLE MAYO TAPER (NEEDLE)
NEEDLE SCORPION MULTI FIRE (NEEDLE) IMPLANT
NS IRRIG 1000ML POUR BTL (IV SOLUTION) IMPLANT
PACK ARTHROSCOPY DSU (CUSTOM PROCEDURE TRAY) ×2 IMPLANT
PACK BASIN DAY SURGERY FS (CUSTOM PROCEDURE TRAY) ×2 IMPLANT
PASSER SUT SWANSON 36MM LOOP (INSTRUMENTS) IMPLANT
PENCIL BUTTON HOLSTER BLD 10FT (ELECTRODE) ×2 IMPLANT
SET ARTHROSCOPY TUBING (MISCELLANEOUS) ×2
SET ARTHROSCOPY TUBING LN (MISCELLANEOUS) ×1 IMPLANT
SLEEVE SCD COMPRESS KNEE MED (MISCELLANEOUS) ×1 IMPLANT
SLING ARM FOAM STRAP LRG (SOFTGOODS) IMPLANT
SLING ARM FOAM STRAP MED (SOFTGOODS) IMPLANT
SLING ARM FOAM STRAP XLG (SOFTGOODS) IMPLANT
SLING ARM IMMOBILIZER LRG (SOFTGOODS) IMPLANT
SLING ARM IMMOBILIZER MED (SOFTGOODS) IMPLANT
SPONGE GAUZE 4X4 12PLY (GAUZE/BANDAGES/DRESSINGS) ×4 IMPLANT
SPONGE LAP 4X18 X RAY DECT (DISPOSABLE) IMPLANT
STRIP CLOSURE SKIN 1/2X4 (GAUZE/BANDAGES/DRESSINGS) IMPLANT
SUCTION FRAZIER TIP 10 FR DISP (SUCTIONS) IMPLANT
SUT ETHIBOND 2 OS 4 DA (SUTURE) IMPLANT
SUT ETHILON 2 0 FS 18 (SUTURE) IMPLANT
SUT ETHILON 3 0 PS 1 (SUTURE) ×2 IMPLANT
SUT FIBERWIRE #2 38 T-5 BLUE (SUTURE)
SUT RETRIEVER MED (INSTRUMENTS) IMPLANT
SUT TIGER TAPE 7 IN WHITE (SUTURE) IMPLANT
SUT VIC AB 0 CT1 27 (SUTURE)
SUT VIC AB 0 CT1 27XBRD ANBCTR (SUTURE) IMPLANT
SUT VIC AB 2-0 SH 27 (SUTURE)
SUT VIC AB 2-0 SH 27XBRD (SUTURE) IMPLANT
SUT VIC AB 3-0 FS2 27 (SUTURE) IMPLANT
SUTURE FIBERWR #2 38 T-5 BLUE (SUTURE) IMPLANT
TAPE FIBER 2MM 7IN #2 BLUE (SUTURE) IMPLANT
TAPE LABRALWHITE 1.5X36 (TAPE) ×3 IMPLANT
TOWEL OR 17X24 6PK STRL BLUE (TOWEL DISPOSABLE) ×2 IMPLANT
WATER STERILE IRR 1000ML POUR (IV SOLUTION) ×2 IMPLANT
YANKAUER SUCT BULB TIP NO VENT (SUCTIONS) IMPLANT

## 2012-10-09 NOTE — Transfer of Care (Signed)
Immediate Anesthesia Transfer of Care Note  Patient: Sarah Galvan  Procedure(s) Performed: Procedure(s): RIGHT SHOULDER ARTHROSCOPY WITH SUBACROMIAL DECOMPRESSION, PARTIAL ACROMIOPLASTY,  DISTAL CLAVICULECTOMY, CAPSULORRHAPHY ANTERIOR WITH LABRAL REPAIR (BANKART) (Right)  Patient Location: PACU  Anesthesia Type:GA combined with regional for post-op pain  Level of Consciousness: awake, alert  and oriented  Airway & Oxygen Therapy: Patient Spontanous Breathing and Patient connected to face mask oxygen  Post-op Assessment: Report given to PACU RN and Post -op Vital signs reviewed and stable  Post vital signs: Reviewed and stable  Complications: No apparent anesthesia complications

## 2012-10-09 NOTE — Brief Op Note (Signed)
10/09/2012  1:21 PM  PATIENT:  Sarah Galvan  52 y.o. female  PRE-OPERATIVE DIAGNOSIS:  RIGHT SHOULDER IMPINGEMENT SYNDROME, DEGENERATIVE ARTHRITIS A/C JOINT DERANGEMENT, COMPLETE RUPTURE OF ROTATOR CUFF  POST-OPERATIVE DIAGNOSIS:  RIGHT SHOULDER IMPINGEMENT SYNDROME, DEGENERATIVE ARTHRITIS A/C JOINT DERANGEMENT, COMPLETE RUPTURE OF ROTATOR CUFF  PROCEDURE:  Procedure(s): RIGHT SHOULDER ARTHROSCOPY WITH SUBACROMIAL DECOMPRESSION, PARTIAL ACROMIOPLASTY,  DISTAL CLAVICULECTOMY, CAPSULORRHAPHY ANTERIOR WITH LABRAL REPAIR (BANKART) (Right)  SURGEON:  Surgeon(s) and Role:    * Loreta Ave, MD - Primary  PHYSICIAN ASSISTANT: Karleigh Bunte M     ANESTHESIA:   general  EBL:  Total I/O In: 2044 [P.O.:444; I.V.:1600] Out: -   SPECIMEN:  No Specimen  DISPOSITION OF SPECIMEN:  N/A  COUNTS:  YES  TOURNIQUET:  * No tourniquets in log *   PATIENT DISPOSITION:  PACU - hemodynamically stable.   }

## 2012-10-09 NOTE — Progress Notes (Signed)
Assisted Dr. Crews with right, ultrasound guided, interscalene  block. Side rails up, monitors on throughout procedure. See vital signs in flow sheet. Tolerated Procedure well. 

## 2012-10-09 NOTE — Anesthesia Preprocedure Evaluation (Addendum)
Anesthesia Evaluation  Patient identified by MRN, date of birth, ID band Patient awake    History of Anesthesia Complications Negative for: history of anesthetic complications  Airway       Dental   Pulmonary neg pulmonary ROS,          Cardiovascular negative cardio ROS      Neuro/Psych negative neurological ROS  negative psych ROS   GI/Hepatic   Endo/Other    Renal/GU      Musculoskeletal   Abdominal   Peds  Hematology   Anesthesia Other Findings   Reproductive/Obstetrics                           Anesthesia Physical Anesthesia Plan Anesthesia Quick Evaluation  

## 2012-10-09 NOTE — Interval H&P Note (Signed)
History and Physical Interval Note:  10/09/2012 7:24 AM  Sarah Galvan  has presented today for surgery, with the diagnosis of RIGHT SHOULDER IMPINGEMENT SYNDROME, DEGENERATIVE ARTHRITIS A/C JOINT DERANGEMENT, COMPLETE RUPTURE OF ROTATOR CUFF  The various methods of treatment have been discussed with the patient and family. After consideration of risks, benefits and other options for treatment, the patient has consented to  Procedure(s): RIGHT SHOULDER ARTHROSCOPY WITH SUBACROMIAL DECOMPRESSION, PARTIAL ACROMIOPLASTY WITH CORACROMIAL RELEASE, DISTAL CLAVICULECTOMY, CAPSULORRHAPHY ANTERIOR WITH LABRAL REPAIR (BANKART) WITH ROTATOR CUFF REPAIR (Right) as a surgical intervention .  The patient's history has been reviewed, patient examined, no change in status, stable for surgery.  I have reviewed the patient's chart and labs.  Questions were answered to the patient's satisfaction.     Jeannine Pennisi F

## 2012-10-09 NOTE — Anesthesia Procedure Notes (Addendum)
Anesthesia Regional Block:  Interscalene brachial plexus block  Pre-Anesthetic Checklist: ,, timeout performed, Correct Patient, Correct Site, Correct Laterality, Correct Procedure, Correct Position, site marked, Risks and benefits discussed,  Surgical consent,  Pre-op evaluation,  At surgeon's request and post-op pain management  Laterality: Right and Upper  Prep: chloraprep       Needles:  Injection technique: Single-shot  Needle Type: Echogenic Needle     Needle Length: 5cm 5 cm Needle Gauge: 21    Additional Needles:  Procedures: ultrasound guided (picture in chart) Interscalene brachial plexus block Narrative:  Start time: 10/09/2012 7:15 AM End time: 10/09/2012 7:23 AM Injection made incrementally with aspirations every 5 mL.  Performed by: Personally  Anesthesiologist: Sheldon Silvan, MD  Interscalene brachial plexus block Procedure Name: Intubation Date/Time: 10/09/2012 7:44 AM Performed by: Burna Cash Pre-anesthesia Checklist: Patient identified, Emergency Drugs available, Suction available and Patient being monitored Patient Re-evaluated:Patient Re-evaluated prior to inductionOxygen Delivery Method: Circle System Utilized Preoxygenation: Pre-oxygenation with 100% oxygen Intubation Type: IV induction Ventilation: Mask ventilation without difficulty Laryngoscope Size: Mac and 3 Grade View: Grade I Tube type: Oral Tube size: 7.0 mm Number of attempts: 1 Airway Equipment and Method: stylet and oral airway Placement Confirmation: ETT inserted through vocal cords under direct vision,  positive ETCO2 and breath sounds checked- equal and bilateral Secured at: 21 cm Tube secured with: Tape Dental Injury: Teeth and Oropharynx as per pre-operative assessment

## 2012-10-09 NOTE — Anesthesia Postprocedure Evaluation (Signed)
  Anesthesia Post-op Note  Patient: Sarah Galvan  Procedure(s) Performed: Procedure(s): RIGHT SHOULDER ARTHROSCOPY WITH SUBACROMIAL DECOMPRESSION, PARTIAL ACROMIOPLASTY,  DISTAL CLAVICULECTOMY, CAPSULORRHAPHY ANTERIOR WITH LABRAL REPAIR (BANKART) (Right)  Patient Location: PACU  Anesthesia Type:GA combined with regional for post-op pain  Level of Consciousness: awake, alert  and oriented  Airway and Oxygen Therapy: Patient Spontanous Breathing  Post-op Pain: none  Post-op Assessment: Post-op Vital signs reviewed  Post-op Vital Signs: Reviewed  Complications: No apparent anesthesia complications

## 2012-10-10 NOTE — Op Note (Signed)
NAME:  Sarah Galvan, BOURGET          ACCOUNT NO.:  [id number]  MEDICAL RECORD NO.:  [id number]  LOCATION:                               FACILITY:  [facility]  PHYSICIAN:  Loreta Ave, M.D. DATE OF BIRTH:  [birth year]  DATE OF PROCEDURE:  10/09/2012 DATE OF DISCHARGE:  10/09/2012                              OPERATIVE REPORT   __________ Right shoulder acute dislocation and large anterior Bankart lesion. __________ posttraumatic subacromial impingement, partial tear in rotator cuff, as well.  Brought to the operating room at this time for persistent instability, for reconstruction decompression.  Lilian.Dace ] necessary for IV and IM analgesia.  PREOPERATIVE DIAGNOSES:  Right shoulder anterior dislocation, Bankart lesion, anterior-posterior labral tears.  Subacromial impingement, reactive bursitis, degenerative joint disease acromioclavicular joint.  POSTOPERATIVE DIAGNOSES:  Right shoulder anterior dislocation, Bankart lesion, anterior-posterior labral tears.  Subacromial impingement, reactive bursitis, degenerative joint disease acromioclavicular joint with only mild fraying of the rotator cuff above and below.  No significant structural tears.  Large Bankart lesion.  PROCEDURE:  Right shoulder exam under anesthesia, arthroscopy. Debridement of labrum from back.  Anterior reconstruction Bankart type with labral tape sutures x3, 2.9 push lock anchors x3.  Bursectomy, acromioplasty, CA ligament release.  Excision of distal clavicle. Debridement of rotator cuff.  SURGEON:  Loreta Ave, M.D.  ASSISTANT:  Genene Churn. Barry Dienes, [place], present throughout the entire case and necessary for timely completion of procedure.  ANESTHESIA:  General.  BLOOD LOSS:  Minimal.  SPECIMENS:  None.  CULTURES:  None.  COMPLICATION:  None.  DRESSINGS:  Soft compressive shoulder immobilizer.  PROCEDURE:  The patient was brought to the operating room, placed on the operating table in supine  position.  After adequate anesthesia had been obtained, shoulder was examined.  Full motion.  Anterior instability. Placed in a beach-chair position.  Shoulder positioner.  Prepped and draped in usual sterile fashion.  Three portals anterior, posterior, lateral.  Arthroscope introduced, shoulder distended and inspected. Bankart lesion from 1 down to 6 o'clock.  Extending down the glenoid. Complex tearing of the labrum, front and back.  All this debrided. Little scuffing on the humeral head but no large Hill-Sachs.  Partial tearing undersurface supraspinatus, infraspinatus debrided.  Biceps tendon, biceps anchor intact.  Cannula placed in the front.  The capsular labral interface was captured at 6, 4, 2 o'clock position with Arthrex equipment.  Advanced and then anchored down in front of the glenoid with predrilled 2.9 push locks at the 5, 3, and 1 o'clock position.  Nice firm [employer]ch[employer].  A little loss of bone from the glenoid very mild.  Instruments removed.  Redirected subacromially. Type 2 acromion reactive bursitis.  Bursa resected.  Top of the cuff, a little abrasion but intact.  Debrided.  Acromioplasty to a type 1 acromion releasing the CA ligament under sterile techniques. Impingement with grade 4 changes AC joint.  Periarticular spurs lateral cm of clavicle resected.  Adequacy of decompression confirmed. Instruments were removed.  Portals were closed with nylon.  Sterile compressive dressing applied.  Shoulder immobilizer applied.  Anesthesia reversed.  Brought to the recovery room.  Tolerated the surgery well. No complications.     Loreta Ave, M.D.  DFM/MEDQ  D:  10/09/2012  T:  10/10/2012  Job:  [id number]

## 2012-10-13 ENCOUNTER — Encounter (HOSPITAL_BASED_OUTPATIENT_CLINIC_OR_DEPARTMENT_OTHER): Payer: Self-pay | Admitting: Orthopedic Surgery

## 2012-11-03 ENCOUNTER — Encounter: Payer: BC Managed Care – PPO | Admitting: Internal Medicine

## 2012-12-01 ENCOUNTER — Other Ambulatory Visit (INDEPENDENT_AMBULATORY_CARE_PROVIDER_SITE_OTHER): Payer: BC Managed Care – PPO

## 2012-12-01 DIAGNOSIS — E039 Hypothyroidism, unspecified: Secondary | ICD-10-CM

## 2012-12-01 LAB — TSH: TSH: 1.63 u[IU]/mL (ref 0.35–5.50)

## 2012-12-04 ENCOUNTER — Encounter: Payer: Self-pay | Admitting: Family Medicine

## 2012-12-04 ENCOUNTER — Encounter: Payer: Self-pay | Admitting: Nurse Practitioner

## 2012-12-04 ENCOUNTER — Ambulatory Visit (INDEPENDENT_AMBULATORY_CARE_PROVIDER_SITE_OTHER): Payer: BC Managed Care – PPO | Admitting: Nurse Practitioner

## 2012-12-04 VITALS — BP 130/70 | HR 60 | Temp 98.1°F | Ht 63.5 in | Wt 199.0 lb

## 2012-12-04 DIAGNOSIS — J029 Acute pharyngitis, unspecified: Secondary | ICD-10-CM

## 2012-12-04 DIAGNOSIS — J309 Allergic rhinitis, unspecified: Secondary | ICD-10-CM

## 2012-12-04 DIAGNOSIS — J302 Other seasonal allergic rhinitis: Secondary | ICD-10-CM

## 2012-12-04 MED ORDER — MOMETASONE FUROATE 50 MCG/ACT NA SUSP: 2.0000 | Freq: Every day | NASAL | Status: DC

## 2012-12-04 NOTE — Progress Notes (Signed)
  Subjective:    Patient ID: Sarah Galvan, female    DOB: Jan 27, 1961, 52 y.o.   MRN: [id number]  Sore Throat  This is a new problem. The current episode started in the past 7 days. The problem has been unchanged. Neither side of throat is experiencing more pain than the other. There has been no fever. The pain is moderate. Associated symptoms include ear pain. Pertinent negatives include no abdominal pain, congestion, coughing, headaches, neck pain, shortness of breath or trouble swallowing. She has tried nothing for the symptoms.      Review of Systems  Constitutional: Positive for fatigue. Negative for fever and chills.  HENT: Positive for ear pain, sore throat and postnasal drip. Negative for hearing loss, congestion, trouble swallowing, neck pain and sinus pressure.   Eyes: Negative for discharge.  Respiratory: Negative for cough and shortness of breath.   Gastrointestinal: Negative for nausea and abdominal pain.  Musculoskeletal: Positive for arthralgias (ongoing knee pain).  Skin: Negative for rash.  Allergic/Immunologic: Positive for environmental allergies.  Neurological: Negative for headaches.  Hematological: Negative for adenopathy.       Objective:   Physical Exam  Vitals reviewed. Constitutional: She is oriented to person, place, and time. She appears well-developed and well-nourished. No distress.  HENT:  Head: Normocephalic and atraumatic.  Right Ear: External ear normal.  Left Ear: External ear normal.  Mouth/Throat: No oropharyngeal exudate.  Posterior pharynx erythematous, R side redder than left., think R ear pain is radiating throat pain, as R ear has normal appearance.  Eyes: Conjunctivae are normal. Right eye exhibits no discharge. Left eye exhibits no discharge.  Cardiovascular: Normal rate, regular rhythm and normal heart sounds.   Pulmonary/Chest: Effort normal and breath sounds normal. No respiratory distress. She has no wheezes.  Musculoskeletal:   Normal gait  Lymphadenopathy:    She has no cervical adenopathy.  Neurological: She is alert and oriented to person, place, and time.  Skin: Skin is dry.  Psychiatric: She has a normal mood and affect. Her behavior is normal. Thought content normal.          Assessment & Plan:  1. Acute pharyngitis See pt instructions. - POCT rapid strep A; negative -strep culture pending   2. Seasonal allergies - mometasone (NASONEX) 50 MCG/ACT nasal spray; Place 2 sprays into the nose daily.  Dispense: 17 g; Refill: 6

## 2012-12-04 NOTE — Patient Instructions (Addendum)
Start sinus rinses daily-Neilmed sinus rinse, get bottle, not netty pot. GArgle with listerene few times daily. Use ibuprophen and benzocaine throat lozenges for comfort. We will call you with strep culture results and prescribe penicillin if it is positive. If this is viral, you will feel better in 5-6 days.

## 2012-12-06 ENCOUNTER — Encounter: Payer: Self-pay | Admitting: Nurse Practitioner

## 2012-12-06 LAB — CULTURE, GROUP A STREP: Organism ID, Bacteria: NORMAL

## 2012-12-08 NOTE — Telephone Encounter (Signed)
Called patient to let her know that her results have not been released by [facility] and that we will contact her as soon as we results have been read. Left message on patient's cell vm.

## 2012-12-11 ENCOUNTER — Telehealth: Payer: Self-pay | Admitting: Family Medicine

## 2012-12-11 NOTE — Telephone Encounter (Signed)
Ok for Tdap when it arrives

## 2012-12-11 NOTE — Telephone Encounter (Signed)
Ok. Per Dr. Beverely Low. Advised patient that they are on backorder and we will call when they are available.

## 2012-12-11 NOTE — Telephone Encounter (Signed)
Is it ok to put an order in for this when we get Tdap again? Her CPE was 08/13/2012 and all was well. GF/RN

## 2012-12-11 NOTE — Telephone Encounter (Signed)
Patient requesting tdap in preparation for a new grandchild. Please advise if okay to schedule.

## 2012-12-27 ENCOUNTER — Other Ambulatory Visit: Payer: Self-pay | Admitting: Family Medicine

## 2012-12-29 NOTE — Telephone Encounter (Signed)
Rx sent to the pharmacy by e-script.//AB/CMA 

## 2013-01-14 ENCOUNTER — Ambulatory Visit: Payer: BC Managed Care – PPO | Admitting: Internal Medicine

## 2013-01-14 ENCOUNTER — Ambulatory Visit (INDEPENDENT_AMBULATORY_CARE_PROVIDER_SITE_OTHER): Payer: BC Managed Care – PPO | Admitting: Nurse Practitioner

## 2013-01-14 ENCOUNTER — Encounter: Payer: Self-pay | Admitting: Nurse Practitioner

## 2013-01-14 VITALS — BP 110/60 | HR 55 | Temp 97.6°F | Ht 63.5 in | Wt 200.5 lb

## 2013-01-14 DIAGNOSIS — R300 Dysuria: Secondary | ICD-10-CM

## 2013-01-14 DIAGNOSIS — R109 Unspecified abdominal pain: Secondary | ICD-10-CM

## 2013-01-14 LAB — POCT URINALYSIS DIPSTICK
Glucose, UA: NEGATIVE
Leukocytes, UA: NEGATIVE
Nitrite, UA: NEGATIVE
Protein, UA: NEGATIVE
Urobilinogen, UA: 0.2

## 2013-01-14 MED ORDER — NITROFURANTOIN MONOHYD MACRO 100 MG PO CAPS: 100.0000 mg | ORAL_CAPSULE | Freq: Two times a day (BID) | ORAL | Status: DC

## 2013-01-14 MED ORDER — PHENAZOPYRIDINE HCL 200 MG PO TABS: 200.0000 mg | ORAL_TABLET | Freq: Three times a day (TID) | ORAL | Status: DC | PRN

## 2013-01-14 NOTE — Patient Instructions (Signed)
Continue to increase water intake. Back pain may become more consistent if this is kidney stone. If you are not feeling better by Monday, consider getting re-evaluated. If you feel worse or develop fever, please seek immediate treatment. Nice to see you-feel better!   Urinary Tract Infection Urinary tract infections (UTIs) can develop anywhere along your urinary tract. Your urinary tract is your body's drainage system for removing wastes and extra water. Your urinary tract includes two kidneys, two ureters, a bladder, and a urethra. Your kidneys are a pair of bean-shaped organs. Each kidney is about the size of your fist. They are located below your ribs, one on each side of your spine. CAUSES Infections are caused by microbes, which are microscopic organisms, including fungi, viruses, and bacteria. These organisms are so small that they can only be seen through a microscope. Bacteria are the microbes that most commonly cause UTIs. SYMPTOMS  Symptoms of UTIs may vary by age and gender of the patient and by the location of the infection. Symptoms in young women typically include a frequent and intense urge to urinate and a painful, burning feeling in the bladder or urethra during urination. Older women and men are more likely to be tired, shaky, and weak and have muscle aches and abdominal pain. A fever may mean the infection is in your kidneys. Other symptoms of a kidney infection include pain in your back or sides below the ribs, nausea, and vomiting. DIAGNOSIS To diagnose a UTI, your caregiver will ask you about your symptoms. Your caregiver also will ask to provide a urine sample. The urine sample will be tested for bacteria and white blood cells. White blood cells are made by your body to help fight infection. TREATMENT  Typically, UTIs can be treated with medication. Because most UTIs are caused by a bacterial infection, they usually can be treated with the use of antibiotics. The choice of  antibiotic and length of treatment depend on your symptoms and the type of bacteria causing your infection. HOME CARE INSTRUCTIONS  If you were prescribed antibiotics, take them exactly as your caregiver instructs you. Finish the medication even if you feel better after you have only taken some of the medication.  Drink enough water and fluids to keep your urine clear or pale yellow.  Avoid caffeine, tea, and carbonated beverages. They tend to irritate your bladder.  Empty your bladder often. Avoid holding urine for long periods of time.  Empty your bladder before and after sexual intercourse.  After a bowel movement, women should cleanse from front to back. Use each tissue only once. SEEK MEDICAL CARE IF:   You have back pain.  You develop a fever.  Your symptoms do not begin to resolve within 3 days. SEEK IMMEDIATE MEDICAL CARE IF:   You have severe back pain or lower abdominal pain.  You develop chills.  You have nausea or vomiting.  You have continued burning or discomfort with urination. MAKE SURE YOU:   Understand these instructions.  Will watch your condition.  Will get help right away if you are not doing well or get worse. Document Released: 02/28/2005 Document Revised: 11/20/2011 Document Reviewed: 06/29/2011 [facility] Patient Information 2014 [place], [place].

## 2013-01-14 NOTE — Progress Notes (Signed)
  Subjective:    Patient ID: Sarah Galvan, female    DOB: 12-May-1961, 52 y.o.   MRN: [id number]  Urinary Tract Infection  This is a new problem. The current episode started in the past 7 days (5 daysa ago began to have intermittent L flank pain, began having spurapubic pressure 3 days ago). The problem occurs every urination (Having bladder pressure w/urination, back has been persistently painful today). The problem has been gradually worsening. The quality of the pain is described as aching and burning. The pain is at a severity of 5/10. There has been no fever. There is no history of pyelonephritis. Associated symptoms include flank pain (left). Pertinent negatives include no chills, discharge, frequency, hematuria, hesitancy, nausea, sweats, urgency or vomiting. Treatments tried: increased water intake. The treatment provided no relief. Her past medical history is significant for kidney stones. Catheterization: had kidney stone several years ago.      Review of Systems  Constitutional: Negative for fever, chills, activity change, appetite change and fatigue.  Respiratory: Negative for cough, chest tightness, shortness of breath and wheezing.   Cardiovascular: Negative for chest pain.  Gastrointestinal: Positive for abdominal pain. Negative for nausea, vomiting, diarrhea and constipation. Abdominal distention: suprapubic.  Genitourinary: Positive for dysuria, flank pain (left) and pelvic pain (suprapubic pain). Negative for hesitancy, urgency, frequency, hematuria and decreased urine volume.  Neurological: Negative for headaches.  Psychiatric/Behavioral: Positive for sleep disturbance (woke with pain few nights ago).       Objective:   Physical Exam  Constitutional: She is oriented to person, place, and time. She appears well-developed and well-nourished. No distress.  HENT:  Head: Normocephalic and atraumatic.  Eyes: Conjunctivae are normal.  Abdominal: Soft. She exhibits no  distension and no mass. There is tenderness (L sided abdominal pain adjacent to umbilicus). There is no rebound and no guarding.  Musculoskeletal:  No CVA tenderness  Neurological: She is alert and oriented to person, place, and time.  Skin: Skin is warm and dry.  Psychiatric: She has a normal mood and affect. Her behavior is normal. Thought content normal.          Assessment & Plan:  1. Dysuria & flank pain, no fever Will treat as UTI, DD: kidney stone, diverticulitis  - POCT urinalysis dipstick-trace blood, no leuks or nites - Urine culture sent - phenazopyridine (PYRIDIUM) 200 MG tablet; Take 1 tablet (200 mg total) by mouth 3 (three) times daily as needed for pain.  Dispense: 10 tablet; Refill: 0 - nitrofurantoin, macrocrystal-monohydrate, (MACROBID) 100 MG capsule; Take 1 capsule (100 mg total) by mouth 2 (two) times daily.  Dispense: 14 capsule; Refill: 0

## 2013-01-16 ENCOUNTER — Ambulatory Visit: Payer: BC Managed Care – PPO | Admitting: Family Medicine

## 2013-01-16 ENCOUNTER — Telehealth: Payer: Self-pay | Admitting: Nurse Practitioner

## 2013-01-16 NOTE — Telephone Encounter (Signed)
Urine culture growing proteus, want to see how pt is feeling. May consider changing ABX to cipro if no improvement. LM on cell & home.

## 2013-01-16 NOTE — Telephone Encounter (Signed)
Patient called back. She is experiencing less low back pain & also less pressure on her bladder. She feels like the medication is working. She will CB Monday 01/19/13 if her symptoms get any worse.

## 2013-01-17 LAB — URINE CULTURE: Colony Count: 50000

## 2013-01-20 ENCOUNTER — Encounter: Payer: Self-pay | Admitting: Family Medicine

## 2013-04-09 ENCOUNTER — Other Ambulatory Visit: Payer: Self-pay

## 2013-05-13 ENCOUNTER — Ambulatory Visit (INDEPENDENT_AMBULATORY_CARE_PROVIDER_SITE_OTHER): Payer: BC Managed Care – PPO | Admitting: Internal Medicine

## 2013-05-13 ENCOUNTER — Encounter: Payer: Self-pay | Admitting: Internal Medicine

## 2013-05-13 ENCOUNTER — Ambulatory Visit: Payer: BC Managed Care – PPO | Admitting: Internal Medicine

## 2013-05-13 VITALS — BP 111/73 | HR 70 | Temp 98.1°F | Wt 198.0 lb

## 2013-05-13 DIAGNOSIS — H04129 Dry eye syndrome of unspecified lacrimal gland: Secondary | ICD-10-CM

## 2013-05-13 DIAGNOSIS — H04122 Dry eye syndrome of left lacrimal gland: Secondary | ICD-10-CM

## 2013-05-13 NOTE — Progress Notes (Signed)
   Subjective:    Patient ID: Sarah Galvan, female    DOB: 1960/10/01, 52 y.o.   MRN: [id number]  HPI Acute visit 2 weeks ago for a day or 2 she felt like something was in her left eye, since then she still has some discomfort-dryness although the FB feeling is gone . Does not recall being in any situation that put her at risk for foreign body in the eye.   Past Medical History  Diagnosis Date  . Allergic rhinitis   . Bronchitis     hx  . Cardiac murmur     dx 30 yr ago-echo done then-not since-  . GERD (gastroesophageal reflux disease)     no meds  . Osteoarthritis   . Seasonal allergies    Past Surgical History  Procedure Laterality Date  . Knee surgery  2005    cadaver replacement of cartilage-lt  . Knee arthroscopy  -1995-2005    ltx3  . Cholecystectomy  2000  . Shoulder arthroscopy with bankart repair Right 10/09/2012    Procedure: RIGHT SHOULDER ARTHROSCOPY WITH SUBACROMIAL DECOMPRESSION, PARTIAL ACROMIOPLASTY,  DISTAL CLAVICULECTOMY, CAPSULORRHAPHY ANTERIOR WITH LABRAL REPAIR (BANKART);  Surgeon: Loreta Ave, MD;  Location: [facility];  Service: Orthopedics;  Laterality: Right;     Review of Systems Vision is normal. Does not wear contacts. Denies any discharge from the eye or redness. Not URI type of symptoms. Saw a lesion on the eye lid?     Objective:   Physical Exam BP 111/73  Pulse 70  Temp(Src) 98.1 F (36.7 C)  Wt 198 lb (89.812 kg)  SpO2 95% General -- alert, well-developed, NAD.  Eyes: EOMI, PERLA  , Anterior chambers normal. No redness or discharge in the conjunctiva. Left upper eyelid has some 1 mm scratch but no swelling, redness or abscess. Fluorescein test: no FB or scratches  Psych-- Cognition and judgment appear intact. Cooperative with normal attention span and concentration. No anxious appearing , no depressed appearing.      Assessment & Plan:  Dry eye, rec systane and see optometrist within a week

## 2013-05-13 NOTE — Patient Instructions (Signed)
Use systane OTC as needed See the eye doctor within a week call if not improving   Optometrist ---[facility], [place], [address], [address]. Phone [phone]

## 2013-05-14 ENCOUNTER — Encounter: Payer: Self-pay | Admitting: Internal Medicine

## 2013-08-13 ENCOUNTER — Encounter: Payer: Self-pay | Admitting: Family Medicine

## 2013-08-13 ENCOUNTER — Ambulatory Visit (INDEPENDENT_AMBULATORY_CARE_PROVIDER_SITE_OTHER): Payer: BC Managed Care – PPO | Admitting: Family Medicine

## 2013-08-13 VITALS — BP 114/74 | HR 64 | Temp 98.4°F | Resp 16 | Wt 195.4 lb

## 2013-08-13 DIAGNOSIS — J392 Other diseases of pharynx: Secondary | ICD-10-CM

## 2013-08-13 DIAGNOSIS — R490 Dysphonia: Secondary | ICD-10-CM

## 2013-08-13 MED ORDER — OMEPRAZOLE 20 MG PO CPDR: 20.0000 mg | DELAYED_RELEASE_CAPSULE | Freq: Every day | ORAL | Status: DC

## 2013-08-13 NOTE — Progress Notes (Signed)
   Subjective:    Patient ID: Barrie LymeJudith Picking, female    DOB: [birth year], 53 y.o.   MRN: [id number]  HPI Hoarseness- sxs started ~1 month ago.  Not feeling ill.  + PND but nothing unusual.  Denies increased GERD.  No sore throat.  No voice strain.  Started Claritin 1 month ago- doesn't take it in the winter.  Pt also notes frequent dry throat despite drinking water, particularly w/ exercise.   Review of Systems For ROS see HPI     Objective:   Physical Exam  Vitals reviewed. Constitutional: She appears well-developed and well-nourished. No distress.  HENT:  Head: Normocephalic and atraumatic.  Right Ear: Tympanic membrane normal.  Left Ear: Tympanic membrane normal.  Nose: Mucosal edema and rhinorrhea present. Right sinus exhibits no maxillary sinus tenderness and no frontal sinus tenderness. Left sinus exhibits no maxillary sinus tenderness and no frontal sinus tenderness.  Mouth/Throat: Mucous membranes are normal. Posterior oropharyngeal erythema (w/ PND) present.  Eyes: Conjunctivae and EOM are normal. Pupils are equal, round, and reactive to light.  Neck: Normal range of motion. Neck supple. No thyromegaly present.  Cardiovascular: Normal rate, regular rhythm and normal heart sounds.   Pulmonary/Chest: Effort normal and breath sounds normal. No respiratory distress. She has no wheezes. She has no rales.  Lymphadenopathy:    She has no cervical adenopathy.          Assessment & Plan:

## 2013-08-13 NOTE — Patient Instructions (Signed)
Call me in 2-3 weeks and let me know how the hoarseness is (if no better, we'll send you to ENT) Start the Omeprazole daily for the acid Continue the Claritin or Zyrtec daily to decrease the post-nasal drip Call with any questions or concerns Hang in there!!!

## 2013-08-13 NOTE — Assessment & Plan Note (Signed)
Recurrent issue for pt.  On OTC antihistamine.  Not currently on PPI despite hx of GERD.  Start Omeprazole.  If no improvement, will refer to ENT.  Pt expressed understanding and is in agreement w/ plan.

## 2013-08-13 NOTE — Assessment & Plan Note (Signed)
New.  May be related to PND or GERD.  Check thyroid as this is pt's concern.  Will follow.

## 2013-08-13 NOTE — Progress Notes (Signed)
Pre visit review using our clinic review tool, if applicable. No additional management support is needed unless otherwise documented below in the visit note. 

## 2013-08-14 LAB — TSH: TSH: 2.81 u[IU]/mL (ref 0.35–5.50)

## 2013-08-17 ENCOUNTER — Encounter: Payer: Self-pay | Admitting: Family Medicine

## 2013-08-17 MED ORDER — OSELTAMIVIR PHOSPHATE 75 MG PO CAPS: 75.0000 mg | ORAL_CAPSULE | Freq: Every day | ORAL | Status: DC

## 2013-09-01 ENCOUNTER — Telehealth: Payer: Self-pay

## 2013-09-01 NOTE — Telephone Encounter (Signed)
Spoke with patient. Patient stated that she was seen in Urgent Care on 21st and she just finished her round of antibiotics (has been on antibiotics for 10 days per patient), but continues to experience cough and chest congestion.  She feels her symptoms are no better and she would like to be seen by Dr. Beverely Lowabori.  An appointment was scheduled with Dr. Beverely Lowabori for tomorrow at 2:45 for persistent cough and chest congestion.

## 2013-09-02 ENCOUNTER — Encounter: Payer: Self-pay | Admitting: Family Medicine

## 2013-09-02 ENCOUNTER — Ambulatory Visit (INDEPENDENT_AMBULATORY_CARE_PROVIDER_SITE_OTHER): Payer: BC Managed Care – PPO | Admitting: Family Medicine

## 2013-09-02 VITALS — BP 126/84 | HR 83 | Temp 99.1°F | Wt 192.2 lb

## 2013-09-02 DIAGNOSIS — J019 Acute sinusitis, unspecified: Secondary | ICD-10-CM

## 2013-09-02 DIAGNOSIS — R6889 Other general symptoms and signs: Secondary | ICD-10-CM

## 2013-09-02 LAB — POCT INFLUENZA A/B
INFLUENZA B, POC: NEGATIVE
Influenza A, POC: NEGATIVE

## 2013-09-02 MED ORDER — PROMETHAZINE-DM 6.25-15 MG/5ML PO SYRP: 5.0000 mL | ORAL_SOLUTION | Freq: Four times a day (QID) | ORAL | Status: DC | PRN

## 2013-09-02 MED ORDER — SULFAMETHOXAZOLE-TMP DS 800-160 MG PO TABS: 1.0000 | ORAL_TABLET | Freq: Two times a day (BID) | ORAL | Status: DC

## 2013-09-02 NOTE — Progress Notes (Signed)
   Subjective:    Patient ID: Sarah Galvan, female    DOB: Sep 15, 1960, 53 y.o.   MRN: [id number]  Cough   URI- pt went to UC on Sat 3/21 dx'd w/ bronchitis and sinus infxn.  Completed 10 day course of Augmentin and Pred taper.  Was feeling well until Monday night when she developed severe sore throat.  Tm 101 yesterday.  'it felt like i had the flu all over again'.  + facial pain/pressure.  R ear pain.  + sore throat.  No N/V/D.  + sick contacts.  + hacking cough.   Review of Systems  Respiratory: Positive for cough.    For ROS see HPI     Objective:   Physical Exam  Vitals reviewed. Constitutional: She appears well-developed and well-nourished. No distress.  HENT:  Head: Normocephalic and atraumatic.  Right Ear: Tympanic membrane normal.  Left Ear: Tympanic membrane normal.  Nose: Mucosal edema and rhinorrhea present. Right sinus exhibits maxillary sinus tenderness and frontal sinus tenderness. Left sinus exhibits maxillary sinus tenderness and frontal sinus tenderness.  Mouth/Throat: Uvula is midline and mucous membranes are normal. Posterior oropharyngeal erythema present. No oropharyngeal exudate.  Eyes: Conjunctivae and EOM are normal. Pupils are equal, round, and reactive to light.  Neck: Normal range of motion. Neck supple.  Cardiovascular: Normal rate, regular rhythm and normal heart sounds.   Pulmonary/Chest: Effort normal and breath sounds normal. No respiratory distress. She has no wheezes.  Lymphadenopathy:    She has no cervical adenopathy.          Assessment & Plan:

## 2013-09-02 NOTE — Progress Notes (Signed)
Pre visit review using our clinic review tool, if applicable. No additional management support is needed unless otherwise documented below in the visit note. 

## 2013-09-02 NOTE — Patient Instructions (Signed)
Follow up as needed This is not the flu!  Thank goodness! Start the Bactrim twice daily- take w/ food Drink plenty of fluids Mucinex DM for cough and congestion Cough syrup as needed- will cause drowsiness REST! Hang in there!!

## 2013-09-02 NOTE — Assessment & Plan Note (Signed)
Pt's sxs and PE consistent w/ infxn.  Since pt has recently had augmentin will switch classes and start Bactrim.  Cough meds prn.  No wheezing on exam today- no need for prednisone.  Will follow.

## 2013-10-09 ENCOUNTER — Telehealth: Payer: Self-pay

## 2013-10-09 NOTE — Telephone Encounter (Signed)
Medication and allergies:  Reviewed and updated  90 day supply/mail order: n/a Local pharmacy:  [facility] [id number] - [place], [place] - [address]   Immunizations due:  UTD   A/P: No changes to personal, family history or past surgical hx PAP- 09/28/09- negative; DUE- wants a pap during upcoming appointment [facility]- never had one  MMG- 09/14/2009- negative; DUE; plans to schedule  [facility]2013 [facility] patient[facility]  To Discuss with Provider: Nothing at this time.

## 2013-10-12 ENCOUNTER — Encounter: Payer: Self-pay | Admitting: Family Medicine

## 2013-10-12 ENCOUNTER — Ambulatory Visit (HOSPITAL_BASED_OUTPATIENT_CLINIC_OR_DEPARTMENT_OTHER)
Admission: RE | Admit: 2013-10-12 | Discharge: 2013-10-12 | Disposition: A | Discharge status: Home / Self Care | Payer: BC Managed Care – PPO | Source: Ambulatory Visit | Attending: Family Medicine | Admitting: Family Medicine

## 2013-10-12 ENCOUNTER — Ambulatory Visit (INDEPENDENT_AMBULATORY_CARE_PROVIDER_SITE_OTHER): Payer: BC Managed Care – PPO | Admitting: Family Medicine

## 2013-10-12 VITALS — BP 122/80 | HR 66 | Temp 98.0°F | Resp 16 | Ht 63.5 in | Wt 198.1 lb

## 2013-10-12 DIAGNOSIS — Z1231 Encounter for screening mammogram for malignant neoplasm of breast: Secondary | ICD-10-CM

## 2013-10-12 DIAGNOSIS — Z0000 Encounter for general adult medical examination without abnormal findings: Secondary | ICD-10-CM

## 2013-10-12 DIAGNOSIS — R490 Dysphonia: Secondary | ICD-10-CM

## 2013-10-12 LAB — HEPATIC FUNCTION PANEL
ALBUMIN: 3.6 g/dL (ref 3.5–5.2)
ALT: 10 U/L (ref 0–35)
AST: 22 U/L (ref 0–37)
Alkaline Phosphatase: 57 U/L (ref 39–117)
BILIRUBIN TOTAL: 1 mg/dL (ref 0.2–1.2)
Bilirubin, Direct: 0 mg/dL (ref 0.0–0.3)
TOTAL PROTEIN: 7.3 g/dL (ref 6.0–8.3)

## 2013-10-12 LAB — BASIC METABOLIC PANEL
BUN: 22 mg/dL (ref 6–23)
CALCIUM: 8.8 mg/dL (ref 8.4–10.5)
CO2: 26 meq/L (ref 19–32)
Chloride: 105 mEq/L (ref 96–112)
Creatinine, Ser: 0.9 mg/dL (ref 0.4–1.2)
GFR: 71.6 mL/min (ref >60.00)
Glucose, Bld: 89 mg/dL (ref 70–99)
Potassium: 3.7 mEq/L (ref 3.5–5.1)
Sodium: 138 mEq/L (ref 135–145)

## 2013-10-12 LAB — TSH: TSH: 2.08 u[IU]/mL (ref 0.35–4.50)

## 2013-10-12 LAB — CBC WITH DIFFERENTIAL/PLATELET
Basophils Absolute: 0 10*3/uL (ref 0.0–0.1)
Basophils Relative: 0.7 % (ref 0.0–3.0)
EOS PCT: 4.5 % (ref 0.0–5.0)
Eosinophils Absolute: 0.3 10*3/uL (ref 0.0–0.7)
HCT: 40.1 % (ref 36.0–46.0)
Hemoglobin: 13.6 g/dL (ref 12.0–15.0)
LYMPHS PCT: 24 % (ref 12.0–46.0)
Lymphs Abs: 1.7 10*3/uL (ref 0.7–4.0)
MCHC: 33.8 g/dL (ref 30.0–36.0)
MCV: 85.5 fl (ref 78.0–100.0)
MONOS PCT: 7.2 % (ref 3.0–12.0)
Monocytes Absolute: 0.5 10*3/uL (ref 0.1–1.0)
NEUTROS PCT: 63.6 % (ref 43.0–77.0)
Neutro Abs: 4.6 10*3/uL (ref 1.4–7.7)
PLATELETS: 253 10*3/uL (ref 150.0–400.0)
RBC: 4.69 Mil/uL (ref 3.87–5.11)
RDW: 13.8 % (ref 11.5–15.5)
WBC: 7.3 10*3/uL (ref 4.0–10.5)

## 2013-10-12 LAB — LIPID PANEL
CHOL/HDL RATIO: 3
Cholesterol: 159 mg/dL (ref 0–200)
HDL: 49.5 mg/dL (ref >39.00)
LDL Cholesterol: 91 mg/dL (ref 0–99)
Triglycerides: 93 mg/dL (ref 0.0–149.0)
VLDL: 18.6 mg/dL (ref 0.0–40.0)

## 2013-10-12 NOTE — Assessment & Plan Note (Signed)
Persisting despite PPI and allergy meds.  Refer to ENT.

## 2013-10-12 NOTE — Assessment & Plan Note (Signed)
Pt's PE WNL.  Due for mammo- order in.  Pt to schedule colonoscopy.  Check labs.  Anticipatory guidance provided.

## 2013-10-12 NOTE — Patient Instructions (Signed)
Schedule your pap only at your convenience We'll call you with your mammogram Call and schedule your colonoscopy We'll notify you of your lab results and make any changes if needed We'll call you with your ENT appt Call with any questions or concerns Keep up the good work! Happy Spring!!!

## 2013-10-12 NOTE — Progress Notes (Signed)
Pre visit review using our clinic review tool, if applicable. No additional management support is needed unless otherwise documented below in the visit note. 

## 2013-10-12 NOTE — Progress Notes (Signed)
   Subjective:    Patient ID: Sarah Galvan, female    DOB: [birth year], 53 y.o.   MRN: [id number]  HPI CPE- due for mammo ([facility]) and colonoscopy.  Was planning on pap today but due to late arrival will reschedule.   Review of Systems Patient reports no vision/ hearing changes, adenopathy,fever, weight change,  persistant, swallowing issues, chest pain, palpitations, edema, persistant/recurrent cough, hemoptysis, dyspnea (rest/exertional/paroxysmal nocturnal), gastrointestinal bleeding (melena, rectal bleeding), abdominal pain, significant heartburn, bowel changes, GU symptoms (dysuria, hematuria, incontinence), Gyn symptoms (abnormal  bleeding, pain),  syncope, focal weakness, memory loss, numbness & tingling, skin/hair/nail changes, abnormal bruising or bleeding, anxiety, or depression.  + chronic hoarseness despite PPI and allergy meds     Objective:   Physical Exam General Appearance:    Alert, cooperative, no distress, appears stated age  Head:    Normocephalic, without obvious abnormality, atraumatic  Eyes:    PERRL, conjunctiva/corneas clear, EOM's intact, fundi    benign, both eyes  Ears:    Normal TM's and external ear canals, both ears  Nose:   Nares normal, septum midline, mucosa normal, no drainage    or sinus tenderness  Throat:   Lips, mucosa, and tongue normal; teeth and gums normal  Neck:   Supple, symmetrical, trachea midline, no adenopathy;    Thyroid: no enlargement/tenderness/nodules  Back:     Symmetric, no curvature, ROM normal, no CVA tenderness  Lungs:     Clear to auscultation bilaterally, respirations unlabored  Chest Wall:    No tenderness or deformity   Heart:    Regular rate and rhythm, S1 and S2 normal, no murmur, rub   or gallop  Breast Exam:    Deferred  Abdomen:     Soft, non-tender, bowel sounds active all four quadrants,    no masses, no organomegaly  Genitalia:    Deferred  Rectal:    Extremities:   Extremities normal, atraumatic, no  cyanosis or edema  Pulses:   2+ and symmetric all extremities  Skin:   Skin color, texture, turgor normal, no rashes or lesions  Lymph nodes:   Cervical, supraclavicular, and axillary nodes normal  Neurologic:   CNII-XII intact, normal strength, sensation and reflexes    throughout          Assessment & Plan:

## 2013-10-17 LAB — VITAMIN D 1,25 DIHYDROXY
VITAMIN D 1, 25 (OH) TOTAL: 29 pg/mL (ref 18–72)
VITAMIN D3 1, 25 (OH): 29 pg/mL
Vitamin D2 1, 25 (OH)2: <8 pg/mL

## 2013-11-19 ENCOUNTER — Encounter: Payer: Self-pay | Admitting: Family Medicine

## 2014-01-01 ENCOUNTER — Other Ambulatory Visit: Payer: Self-pay | Admitting: Family Medicine

## 2014-01-01 NOTE — Telephone Encounter (Signed)
Med filled.  

## 2014-01-07 ENCOUNTER — Ambulatory Visit: Payer: BC Managed Care – PPO | Admitting: Family Medicine

## 2014-01-13 ENCOUNTER — Ambulatory Visit (INDEPENDENT_AMBULATORY_CARE_PROVIDER_SITE_OTHER): Payer: BC Managed Care – PPO | Admitting: Family Medicine

## 2014-01-13 ENCOUNTER — Other Ambulatory Visit (HOSPITAL_COMMUNITY)
Admission: RE | Admit: 2014-01-13 | Discharge: 2014-01-13 | Disposition: A | Discharge status: Home / Self Care | Payer: BC Managed Care – PPO | Source: Ambulatory Visit | Attending: Family Medicine | Admitting: Family Medicine

## 2014-01-13 ENCOUNTER — Encounter: Payer: Self-pay | Admitting: Family Medicine

## 2014-01-13 VITALS — BP 120/80 | HR 67 | Temp 98.0°F | Resp 16 | Wt 199.5 lb

## 2014-01-13 DIAGNOSIS — Z1151 Encounter for screening for human papillomavirus (HPV): Secondary | ICD-10-CM | POA: Insufficient documentation

## 2014-01-13 DIAGNOSIS — Z124 Encounter for screening for malignant neoplasm of cervix: Secondary | ICD-10-CM | POA: Insufficient documentation

## 2014-01-13 DIAGNOSIS — Z01419 Encounter for gynecological examination (general) (routine) without abnormal findings: Secondary | ICD-10-CM | POA: Insufficient documentation

## 2014-01-13 NOTE — Patient Instructions (Signed)
Schedule your complete physical for May We'll notify you of your pap results and make any changes if needed Keep up the good work!  You look great! Call with any questions or concerns Enjoy the rest of your summer!

## 2014-01-13 NOTE — Progress Notes (Signed)
Pre visit review using our clinic review tool, if applicable. No additional management support is needed unless otherwise documented below in the visit note. 

## 2014-01-13 NOTE — Progress Notes (Signed)
   Subjective:    Patient ID: Sarah Galvan, female    DOB: [birth year], 53 y.o.   MRN: [id number]  HPI Pt here today for pap.  Denies vaginal pain, discharge, itching, abnormal bleeding.  Having periods every 3 months.   Review of Systems For ROS see HPI     Objective:   Physical Exam  Vitals reviewed. Constitutional: She appears well-developed and well-nourished. No distress.  Genitourinary: Rectal exam shows no external hemorrhoid. There is no rash, tenderness or lesion on the right labia. There is no rash, tenderness or lesion on the left labia. Uterus is not deviated, not enlarged, not fixed and not tender. Cervix exhibits friability (mild). Cervix exhibits no motion tenderness and no discharge. Right adnexum displays no mass, no tenderness and no fullness. Left adnexum displays no mass, no tenderness and no fullness. No erythema, tenderness or bleeding around the vagina. No vaginal discharge found.          Assessment & Plan:

## 2014-01-13 NOTE — Assessment & Plan Note (Signed)
Pap collected.  Pt tolerated w/o difficulty.  Will await results.

## 2014-01-18 LAB — CYTOLOGY - PAP

## 2014-02-26 ENCOUNTER — Ambulatory Visit (INDEPENDENT_AMBULATORY_CARE_PROVIDER_SITE_OTHER): Payer: BC Managed Care – PPO | Admitting: Family Medicine

## 2014-02-26 ENCOUNTER — Encounter: Payer: Self-pay | Admitting: Family Medicine

## 2014-02-26 VITALS — BP 128/86 | HR 62 | Temp 98.0°F | Resp 17 | Wt 203.5 lb

## 2014-02-26 DIAGNOSIS — J019 Acute sinusitis, unspecified: Secondary | ICD-10-CM

## 2014-02-26 MED ORDER — AMOXICILLIN 875 MG PO TABS: 875.0000 mg | ORAL_TABLET | Freq: Two times a day (BID) | ORAL | Status: AC

## 2014-02-26 NOTE — Progress Notes (Signed)
Pre visit review using our clinic review tool, if applicable. No additional management support is needed unless otherwise documented below in the visit note. 

## 2014-02-26 NOTE — Assessment & Plan Note (Signed)
Pt w/ both frontal and maxillary sinus infxns.  Hx of similar.  Start abx.  Cough meds prn- pt reports she has left over cough syrup at home.  Reviewed supportive care and red flags that should prompt return.  Pt expressed understanding and is in agreement w/ plan.

## 2014-02-26 NOTE — Progress Notes (Signed)
   Subjective:    Patient ID: Sarah Galvan, female    DOB: 28-Sep-1960, 53 y.o.   MRN: [id number]  Cough Associated symptoms include headaches.  Sinusitis Associated symptoms include coughing and headaches.  Headache  Associated symptoms include coughing.   URI- sxs started Tuesday w/ sore throat, developed sinus pain, nasal congestion, HA, bilateral ear pain, tooth pain on Wednesday.  Low grade temp yesterday.  Mild nausea.  + sick contacts.  No cough.   Review of Systems  Respiratory: Positive for cough.   Neurological: Positive for headaches.   For ROS see HPI     Objective:   Physical Exam  Vitals reviewed. Constitutional: She is oriented to person, place, and time. She appears well-developed and well-nourished. No distress.  HENT:  Head: Normocephalic and atraumatic.  Right Ear: Tympanic membrane normal.  Left Ear: Tympanic membrane normal.  Nose: Mucosal edema and rhinorrhea present. Right sinus exhibits maxillary sinus tenderness and frontal sinus tenderness. Left sinus exhibits maxillary sinus tenderness and frontal sinus tenderness.  Mouth/Throat: Uvula is midline and mucous membranes are normal. Posterior oropharyngeal erythema present. No oropharyngeal exudate.  Eyes: Conjunctivae and EOM are normal. Pupils are equal, round, and reactive to light.  Neck: Normal range of motion. Neck supple. No thyromegaly present.  Cardiovascular: Normal rate, regular rhythm, normal heart sounds and intact distal pulses.   No murmur heard. Pulmonary/Chest: Effort normal and breath sounds normal. No respiratory distress. She has no wheezes.  Musculoskeletal: She exhibits no edema.  Lymphadenopathy:    She has no cervical adenopathy.  Neurological: She is alert and oriented to person, place, and time.  Skin: Skin is warm and dry.  Psychiatric: She has a normal mood and affect. Her behavior is normal.          Assessment & Plan:

## 2014-02-26 NOTE — Patient Instructions (Signed)
Follow up as needed Start Amoxicillin twice daily- take w/ food Drink plenty of fluids Mucinex DM for cough and congestion REST! Call with any questions or concerns Hang in there!

## 2014-04-04 ENCOUNTER — Encounter: Payer: Self-pay | Admitting: Family Medicine

## 2014-04-05 ENCOUNTER — Encounter: Payer: Self-pay | Admitting: Family Medicine

## 2014-04-08 ENCOUNTER — Ambulatory Visit (INDEPENDENT_AMBULATORY_CARE_PROVIDER_SITE_OTHER): Payer: BC Managed Care – PPO | Admitting: General Practice

## 2014-04-08 ENCOUNTER — Encounter: Payer: Self-pay | Admitting: Family Medicine

## 2014-04-08 ENCOUNTER — Ambulatory Visit (INDEPENDENT_AMBULATORY_CARE_PROVIDER_SITE_OTHER): Payer: BC Managed Care – PPO | Admitting: Family Medicine

## 2014-04-08 VITALS — BP 130/80 | HR 68 | Temp 98.1°F | Resp 16 | Wt 198.4 lb

## 2014-04-08 DIAGNOSIS — Z23 Encounter for immunization: Secondary | ICD-10-CM

## 2014-04-08 DIAGNOSIS — M799 Soft tissue disorder, unspecified: Secondary | ICD-10-CM

## 2014-04-08 DIAGNOSIS — M7989 Other specified soft tissue disorders: Secondary | ICD-10-CM

## 2014-04-08 MED ORDER — DICLOFENAC SODIUM 1 % TD GEL: 2.0000 g | Freq: Four times a day (QID) | TRANSDERMAL | Status: DC

## 2014-04-08 NOTE — Patient Instructions (Signed)
Follow up as needed We'll call you with your Hand appt Use the Voltaren gel Call with any questions or concerns Happy Holidays!!!

## 2014-04-08 NOTE — Progress Notes (Signed)
   Subjective:    Patient ID: Sarah Galvan, female    DOB: 03-22-1961, 53 y.o.   MRN: [id number]  HPI Thumb sore- 3 weeks ago developed 'bump' on L posterior thumb.  + TTP, erythema.  No known injury or bite.  Redness and swelling have lessened but pain remains and stiff when bending IP joint.   Review of Systems For ROS see HPI     Objective:   Physical Exam  Constitutional: She appears well-developed and well-nourished. No distress.  Cardiovascular: Intact distal pulses.   Musculoskeletal: She exhibits tenderness (tenderness over soft tissue mass on dorsum of L thumb at IP joint).  Skin: Skin is warm and dry. No erythema.  Vitals reviewed.         Assessment & Plan:

## 2014-04-08 NOTE — Progress Notes (Signed)
Pre visit review using our clinic review tool, if applicable. No additional management support is needed unless otherwise documented below in the visit note. 

## 2014-04-11 NOTE — Assessment & Plan Note (Signed)
New.  On dorsum of L thumb.  Suspect ganglion cyst or other benign etiology but due to pain, will refer to hand specialist for evaluation and tx.  Start voltaren gel for pain/inflammation.  Pt expressed understanding and is in agreement w/ plan.

## 2014-04-12 ENCOUNTER — Telehealth: Payer: Self-pay | Admitting: *Deleted

## 2014-04-12 NOTE — Telephone Encounter (Signed)
PA approved for Voltaren 1% gel effective 03/13/2014 through 04/12/2015

## 2014-06-02 ENCOUNTER — Encounter: Payer: Self-pay | Admitting: Family Medicine

## 2014-06-02 MED ORDER — FLUCONAZOLE 150 MG PO TABS: 150.0000 mg | ORAL_TABLET | Freq: Once | ORAL | Status: DC

## 2014-06-21 ENCOUNTER — Other Ambulatory Visit: Payer: Self-pay | Admitting: General Practice

## 2014-06-21 MED ORDER — LEVOTHYROXINE SODIUM 50 MCG PO TABS: ORAL_TABLET | ORAL | Status: DC

## 2014-07-08 ENCOUNTER — Encounter: Payer: Self-pay | Admitting: Family Medicine

## 2014-09-09 ENCOUNTER — Encounter: Payer: Self-pay | Admitting: Internal Medicine

## 2014-09-09 ENCOUNTER — Ambulatory Visit (INDEPENDENT_AMBULATORY_CARE_PROVIDER_SITE_OTHER): Payer: BC Managed Care – PPO | Admitting: Internal Medicine

## 2014-09-09 ENCOUNTER — Ambulatory Visit (INDEPENDENT_AMBULATORY_CARE_PROVIDER_SITE_OTHER)
Admission: RE | Admit: 2014-09-09 | Discharge: 2014-09-09 | Disposition: A | Discharge status: Home / Self Care | Payer: BC Managed Care – PPO | Source: Ambulatory Visit | Attending: Internal Medicine | Admitting: Internal Medicine

## 2014-09-09 VITALS — BP 122/80 | HR 63 | Temp 98.5°F | Resp 16 | Ht 63.5 in | Wt 201.0 lb

## 2014-09-09 DIAGNOSIS — R05 Cough: Secondary | ICD-10-CM | POA: Diagnosis not present

## 2014-09-09 DIAGNOSIS — J189 Pneumonia, unspecified organism: Secondary | ICD-10-CM | POA: Diagnosis not present

## 2014-09-09 DIAGNOSIS — R059 Cough, unspecified: Secondary | ICD-10-CM | POA: Insufficient documentation

## 2014-09-09 MED ORDER — HYDROCODONE-HOMATROPINE 5-1.5 MG/5ML PO SYRP: 5.0000 mL | ORAL_SOLUTION | Freq: Three times a day (TID) | ORAL | Status: DC | PRN

## 2014-09-09 MED ORDER — AMOXICILLIN-POT CLAVULANATE 875-125 MG PO TABS
1.0000 | ORAL_TABLET | Freq: Two times a day (BID) | ORAL | Status: DC
Start: 2014-09-09 — End: 2014-10-12

## 2014-09-09 NOTE — Patient Instructions (Signed)
Cough, Adult  A cough is a reflex that helps clear your throat and airways. It can help heal the body or may be a reaction to an irritated airway. A cough may only last 2 or 3 weeks (acute) or may last more than 8 weeks (chronic).  CAUSES Acute cough:  Viral or bacterial infections. Chronic cough:  Infections.  Allergies.  Asthma.  Post-nasal drip.  Smoking.  Heartburn or acid reflux.  Some medicines.  Chronic lung problems (COPD).  Cancer. SYMPTOMS   Cough.  Fever.  Chest pain.  Increased breathing rate.  High-pitched whistling sound when breathing (wheezing).  Colored mucus that you cough up (sputum). TREATMENT   A bacterial cough may be treated with antibiotic medicine.  A viral cough must run its course and will not respond to antibiotics.  Your caregiver may recommend other treatments if you have a chronic cough. HOME CARE INSTRUCTIONS   Only take over-the-counter or prescription medicines for pain, discomfort, or fever as directed by your caregiver. Use cough suppressants only as directed by your caregiver.  Use a cold steam vaporizer or humidifier in your bedroom or home to help loosen secretions.  Sleep in a semi-upright position if your cough is worse at night.  Rest as needed.  Stop smoking if you smoke. SEEK IMMEDIATE MEDICAL CARE IF:   You have pus in your sputum.  Your cough starts to worsen.  You cannot control your cough with suppressants and are losing sleep.  You begin coughing up blood.  You have difficulty breathing.  You develop pain which is getting worse or is uncontrolled with medicine.  You have a fever. MAKE SURE YOU:   Understand these instructions.  Will watch your condition.  Will get help right away if you are not doing well or get worse. Document Released: 11/17/2010 Document Revised: 08/13/2011 Document Reviewed: 11/17/2010 Exit[employer] Patient Information 2015 [employer]. This information is not intended  to replace advice given to you by your health care provider. Make sure you discuss any questions you have with your health care provider.  

## 2014-09-09 NOTE — Progress Notes (Signed)
Pre visit review using our clinic review tool, if applicable. No additional management support is needed unless otherwise documented below in the visit note. 

## 2014-09-12 NOTE — Progress Notes (Signed)
   Subjective:    Patient ID: Sarah Galvan, female    DOB: 03-26-1961, 54 y.o.   MRN: [id number]  Cough This is a new problem. The current episode started in the past 7 days. The problem has been unchanged. The problem occurs every few hours. The cough is productive of purulent sputum. Associated symptoms include chills and a fever. Pertinent negatives include no chest pain, ear congestion, ear pain, headaches, heartburn, hemoptysis, myalgias, nasal congestion, postnasal drip, rash, rhinorrhea, sore throat, shortness of breath, sweats, weight loss or wheezing. She has tried OTC cough suppressant for the symptoms. The treatment provided mild relief.      Review of Systems  Constitutional: Positive for fever and chills. Negative for weight loss, diaphoresis, activity change, appetite change, fatigue and unexpected weight change.  HENT: Negative.  Negative for ear pain, postnasal drip, rhinorrhea and sore throat.   Eyes: Negative.   Respiratory: Positive for cough. Negative for hemoptysis, choking, chest tightness, shortness of breath, wheezing and stridor.   Cardiovascular: Negative.  Negative for chest pain, palpitations and leg swelling.  Gastrointestinal: Negative.  Negative for heartburn, nausea, vomiting, abdominal pain, diarrhea, constipation and blood in stool.  Endocrine: Negative.   Genitourinary: Negative.   Musculoskeletal: Negative.  Negative for myalgias, back pain and arthralgias.  Skin: Negative.  Negative for rash.  Allergic/Immunologic: Negative.   Neurological: Negative.  Negative for dizziness, tremors, weakness, light-headedness and headaches.  Hematological: Negative.  Negative for adenopathy. Does not bruise/bleed easily.  Psychiatric/Behavioral: Negative.        Objective:   Physical Exam  Constitutional: She is oriented to person, place, and time. She appears well-developed and well-nourished.  Non-toxic appearance. She does not have a sickly appearance. She  does not appear ill. No distress.  HENT:  Head: Normocephalic and atraumatic.  Mouth/Throat: Oropharynx is clear and moist. No oropharyngeal exudate.  Eyes: Conjunctivae are normal. Right eye exhibits no discharge. Left eye exhibits no discharge. No scleral icterus.  Neck: Normal range of motion. Neck supple. No JVD present. No tracheal deviation present. No thyromegaly present.  Cardiovascular: Normal rate, regular rhythm, normal heart sounds and intact distal pulses.  Exam reveals no gallop and no friction rub.   No murmur heard. Pulmonary/Chest: Effort normal and breath sounds normal. No stridor. No respiratory distress. She has no wheezes. She has no rales. She exhibits no tenderness.  Abdominal: Soft. Bowel sounds are normal. She exhibits no distension and no mass. There is no tenderness. There is no rebound and no guarding.  Musculoskeletal: Normal range of motion. She exhibits no edema or tenderness.  Lymphadenopathy:    She has no cervical adenopathy.  Neurological: She is oriented to person, place, and time.  Skin: Skin is warm and dry. No rash noted. She is not diaphoretic. No erythema. No pallor.  Vitals reviewed.         Assessment & Plan:

## 2014-09-12 NOTE — Assessment & Plan Note (Signed)
Her CXR is WNL but she appears to have PNA Will treat with augmentin and I have offered her a cough suppressant

## 2014-09-12 NOTE — Assessment & Plan Note (Signed)
Her CXR is normal.

## 2014-09-24 ENCOUNTER — Encounter: Payer: Self-pay | Admitting: Family Medicine

## 2014-10-04 ENCOUNTER — Encounter: Payer: Self-pay | Admitting: Family Medicine

## 2014-10-04 MED ORDER — LEVOTHYROXINE SODIUM 50 MCG PO TABS: ORAL_TABLET | ORAL | Status: DC

## 2014-10-04 NOTE — Telephone Encounter (Signed)
Med filled to local pharmacy and also to mail order.

## 2014-10-12 ENCOUNTER — Encounter: Payer: Self-pay | Admitting: Family Medicine

## 2014-10-12 ENCOUNTER — Ambulatory Visit (INDEPENDENT_AMBULATORY_CARE_PROVIDER_SITE_OTHER): Payer: BC Managed Care – PPO | Admitting: Family Medicine

## 2014-10-12 VITALS — BP 120/84 | HR 70 | Temp 97.9°F | Resp 16 | Wt 201.4 lb

## 2014-10-12 DIAGNOSIS — J0110 Acute frontal sinusitis, unspecified: Secondary | ICD-10-CM | POA: Diagnosis not present

## 2014-10-12 MED ORDER — AMOXICILLIN 875 MG PO TABS: 875.0000 mg | ORAL_TABLET | Freq: Two times a day (BID) | ORAL | Status: DC

## 2014-10-12 NOTE — Progress Notes (Signed)
Pre visit review using our clinic review tool, if applicable. No additional management support is needed unless otherwise documented below in the visit note. 

## 2014-10-12 NOTE — Patient Instructions (Signed)
Follow up as needed Start the Amoxicillin twice daily- take w/ food Drink plenty of fluids Continue the allergy medication daily to decrease congestion REST! Call with any questions or concerns Hang in there!!!

## 2014-10-12 NOTE — Progress Notes (Signed)
   Subjective:    Patient ID: Sarah Galvan, female    DOB: [birth year], 54 y.o.   MRN: [id number]  HPI URI- pt has had nasal congestion for 'months' but over the weekend pt could feel worsening pressure.  Now pain.  No fever.  Mild tooth pain.  Pain is more frontal this time than maxillary.  Bilateral ear fullness, R ear pain.  Cough due to PND.  No N/V.  + sick contacts.   Review of Systems For ROS see HPI     Objective:   Physical Exam  Constitutional: She appears well-developed and well-nourished. No distress.  HENT:  Head: Normocephalic and atraumatic.  Right Ear: Tympanic membrane normal.  Left Ear: Tympanic membrane normal.  Nose: Mucosal edema and rhinorrhea present. Right sinus exhibits maxillary sinus tenderness and frontal sinus tenderness. Left sinus exhibits maxillary sinus tenderness and frontal sinus tenderness.  Mouth/Throat: Uvula is midline and mucous membranes are normal. Posterior oropharyngeal erythema present. No oropharyngeal exudate.  Eyes: Conjunctivae and EOM are normal. Pupils are equal, round, and reactive to light.  Neck: Normal range of motion. Neck supple.  Cardiovascular: Normal rate, regular rhythm and normal heart sounds.   Pulmonary/Chest: Effort normal and breath sounds normal. No respiratory distress. She has no wheezes.  Lymphadenopathy:    She has no cervical adenopathy.  Vitals reviewed.         Assessment & Plan:

## 2014-10-12 NOTE — Assessment & Plan Note (Signed)
Pt's sxs and PE consistent w/ infxn.  Start abx.  Reviewed supportive care and red flags that should prompt return.  Pt expressed understanding and is in agreement w/ plan.  

## 2014-11-15 ENCOUNTER — Telehealth: Payer: Self-pay | Admitting: Family Medicine

## 2014-11-15 NOTE — Telephone Encounter (Signed)
Pre Visit letter sent  °

## 2014-12-03 ENCOUNTER — Encounter: Payer: Self-pay | Admitting: Gastroenterology

## 2014-12-03 ENCOUNTER — Encounter: Payer: Self-pay | Admitting: *Deleted

## 2014-12-03 ENCOUNTER — Telehealth: Payer: Self-pay | Admitting: *Deleted

## 2014-12-03 NOTE — Telephone Encounter (Signed)
Pre-Visit Call completed with patient and chart updated.   Pre-Visit Info documented in Specialty Comments under [employer].    

## 2014-12-07 ENCOUNTER — Ambulatory Visit (INDEPENDENT_AMBULATORY_CARE_PROVIDER_SITE_OTHER): Payer: BC Managed Care – PPO | Admitting: Family Medicine

## 2014-12-07 ENCOUNTER — Other Ambulatory Visit: Payer: Self-pay | Admitting: Family Medicine

## 2014-12-07 ENCOUNTER — Encounter: Payer: Self-pay | Admitting: Family Medicine

## 2014-12-07 ENCOUNTER — Ambulatory Visit (HOSPITAL_BASED_OUTPATIENT_CLINIC_OR_DEPARTMENT_OTHER)
Admission: RE | Admit: 2014-12-07 | Discharge: 2014-12-07 | Disposition: A | Discharge status: Home / Self Care | Payer: BC Managed Care – PPO | Source: Ambulatory Visit | Attending: Family Medicine | Admitting: Family Medicine

## 2014-12-07 VITALS — BP 122/78 | HR 72 | Temp 98.0°F | Resp 16 | Ht 63.5 in | Wt 206.2 lb

## 2014-12-07 DIAGNOSIS — Z1231 Encounter for screening mammogram for malignant neoplasm of breast: Secondary | ICD-10-CM

## 2014-12-07 DIAGNOSIS — Z0000 Encounter for general adult medical examination without abnormal findings: Secondary | ICD-10-CM | POA: Diagnosis not present

## 2014-12-07 LAB — CBC WITH DIFFERENTIAL/PLATELET
Basophils Absolute: 0 10*3/uL (ref 0.0–0.1)
Basophils Relative: 0.7 % (ref 0.0–3.0)
EOS ABS: 0.2 10*3/uL (ref 0.0–0.7)
Eosinophils Relative: 3.6 % (ref 0.0–5.0)
HEMATOCRIT: 40.3 % (ref 36.0–46.0)
HEMOGLOBIN: 13.5 g/dL (ref 12.0–15.0)
LYMPHS ABS: 1.6 10*3/uL (ref 0.7–4.0)
Lymphocytes Relative: 25.2 % (ref 12.0–46.0)
MCHC: 33.5 g/dL (ref 30.0–36.0)
MCV: 85.7 fl (ref 78.0–100.0)
Monocytes Absolute: 0.4 10*3/uL (ref 0.1–1.0)
Monocytes Relative: 6.9 % (ref 3.0–12.0)
NEUTROS ABS: 3.9 10*3/uL (ref 1.4–7.7)
Neutrophils Relative %: 63.6 % (ref 43.0–77.0)
Platelets: 246 10*3/uL (ref 150.0–400.0)
RBC: 4.71 Mil/uL (ref 3.87–5.11)
RDW: 15 % (ref 11.5–15.5)
WBC: 6.2 10*3/uL (ref 4.0–10.5)

## 2014-12-07 LAB — BASIC METABOLIC PANEL
BUN: 15 mg/dL (ref 6–23)
CHLORIDE: 106 meq/L (ref 96–112)
CO2: 24 meq/L (ref 19–32)
Calcium: 8.8 mg/dL (ref 8.4–10.5)
Creatinine, Ser: 0.87 mg/dL (ref 0.40–1.20)
GFR: 72.23 mL/min (ref >60.00)
GLUCOSE: 103 mg/dL — AB (ref 70–99)
Potassium: 3.8 mEq/L (ref 3.5–5.1)
Sodium: 137 mEq/L (ref 135–145)

## 2014-12-07 LAB — HEPATIC FUNCTION PANEL
ALK PHOS: 68 U/L (ref 39–117)
ALT: 10 U/L (ref 0–35)
AST: 17 U/L (ref 0–37)
Albumin: 3.5 g/dL (ref 3.5–5.2)
BILIRUBIN TOTAL: 0.7 mg/dL (ref 0.2–1.2)
Bilirubin, Direct: 0.1 mg/dL (ref 0.0–0.3)
Total Protein: 7.1 g/dL (ref 6.0–8.3)

## 2014-12-07 LAB — LIPID PANEL
Cholesterol: 159 mg/dL (ref 0–200)
HDL: 47.7 mg/dL (ref >39.00)
LDL Cholesterol: 97 mg/dL (ref 0–99)
NONHDL: 111.3
Total CHOL/HDL Ratio: 3
Triglycerides: 73 mg/dL (ref 0.0–149.0)
VLDL: 14.6 mg/dL (ref 0.0–40.0)

## 2014-12-07 LAB — TSH: TSH: 5.16 u[IU]/mL — ABNORMAL HIGH (ref 0.35–4.50)

## 2014-12-07 LAB — VITAMIN D 25 HYDROXY (VIT D DEFICIENCY, FRACTURES): VITD: 16.57 ng/mL — ABNORMAL LOW (ref 30.00–100.00)

## 2014-12-07 NOTE — Assessment & Plan Note (Signed)
Pt's PE WNL.  UTD on pap.  Due for colonoscopy- this is scheduled.  Due for mammo- pt is going to stop downstairs and schedule.  Check labs.  Anticipatory guidance provided.

## 2014-12-07 NOTE — Patient Instructions (Signed)
Follow up in 1 year or as needed We'll notify you of your lab results and make any changes if needed Keep up the good work on healthy diet and regular exercise Go get your mammogram! Call with any questions or concerns Hang in there!!!

## 2014-12-07 NOTE — Progress Notes (Signed)
   Subjective:    Patient ID: Sarah Galvan, female    DOB: 07/27/1960, 54 y.o.   MRN: [id number]  HPI CPE- UTD on pap.  Due for mammo- plans to go today.  Has appt scheduled for colonoscopy later this summer.   Review of Systems Patient reports no vision/ hearing changes, adenopathy,fever, weight change,  persistant/recurrent hoarseness , swallowing issues, chest pain, palpitations, edema, persistant/recurrent cough, hemoptysis, dyspnea (rest/exertional/paroxysmal nocturnal), gastrointestinal bleeding (melena, rectal bleeding), abdominal pain, significant heartburn, bowel changes, GU symptoms (dysuria, hematuria, incontinence), Gyn symptoms (abnormal  bleeding, pain),  syncope, focal weakness, memory loss, numbness & tingling, skin/hair/nail changes, abnormal bruising or bleeding, anxiety, or depression.     Objective:   Physical Exam General Appearance:    Alert, cooperative, no distress, appears stated age  Head:    Normocephalic, without obvious abnormality, atraumatic  Eyes:    PERRL, conjunctiva/corneas clear, EOM's intact, fundi    benign, both eyes  Ears:    Normal TM's and external ear canals, both ears  Nose:   Nares normal, septum midline, mucosa normal, no drainage    or sinus tenderness  Throat:   Lips, mucosa, and tongue normal; teeth and gums normal  Neck:   Supple, symmetrical, trachea midline, no adenopathy;    Thyroid: no enlargement/tenderness/nodules  Back:     Symmetric, no curvature, ROM normal, no CVA tenderness  Lungs:     Clear to auscultation bilaterally, respirations unlabored  Chest Wall:    No tenderness or deformity   Heart:    Regular rate and rhythm, S1 and S2 normal, no murmur, rub   or gallop  Breast Exam:    Deferred  Abdomen:     Soft, non-tender, bowel sounds active all four quadrants,    no masses, no organomegaly  Genitalia:    Deferred  Rectal:    Extremities:   Extremities normal, atraumatic, no cyanosis or edema  Pulses:   2+ and  symmetric all extremities  Skin:   Skin color, texture, turgor normal, no rashes or lesions  Lymph nodes:   Cervical, supraclavicular, and axillary nodes normal  Neurologic:   CNII-XII intact, normal strength, sensation and reflexes    throughout          Assessment & Plan:

## 2014-12-07 NOTE — Progress Notes (Signed)
Pre visit review using our clinic review tool, if applicable. No additional management support is needed unless otherwise documented below in the visit note. 

## 2014-12-08 ENCOUNTER — Encounter: Payer: Self-pay | Admitting: Family Medicine

## 2014-12-08 ENCOUNTER — Other Ambulatory Visit: Payer: Self-pay | Admitting: General Practice

## 2014-12-08 MED ORDER — VITAMIN D (ERGOCALCIFEROL) 1.25 MG (50000 UNIT) PO CAPS: 50000.0000 [IU] | ORAL_CAPSULE | ORAL | Status: DC

## 2014-12-08 MED ORDER — LEVOTHYROXINE SODIUM 75 MCG PO TABS: 75.0000 ug | ORAL_TABLET | Freq: Every day | ORAL | Status: DC

## 2015-01-31 ENCOUNTER — Ambulatory Visit (AMBULATORY_SURGERY_CENTER): Payer: Self-pay

## 2015-01-31 VITALS — Ht 63.0 in | Wt 207.6 lb

## 2015-01-31 DIAGNOSIS — Z83719 Family history of colon polyps, unspecified: Secondary | ICD-10-CM

## 2015-01-31 DIAGNOSIS — Z8371 Family history of colonic polyps: Secondary | ICD-10-CM

## 2015-01-31 MED ORDER — SUPREP BOWEL PREP KIT 17.5-3.13-1.6 GM/177ML PO SOLN: 1.0000 | Freq: Once | ORAL | Status: DC

## 2015-01-31 NOTE — Progress Notes (Signed)
No allergies to eggs or soy No diet/weight loss meds No home oxygen No past problems with anesthesia  Has email  Emmi instructions given for colonoscopy 

## 2015-02-10 ENCOUNTER — Emergency Department (HOSPITAL_BASED_OUTPATIENT_CLINIC_OR_DEPARTMENT_OTHER): Payer: BC Managed Care – PPO

## 2015-02-10 ENCOUNTER — Emergency Department (HOSPITAL_BASED_OUTPATIENT_CLINIC_OR_DEPARTMENT_OTHER)
Admission: EM | Admit: 2015-02-10 | Discharge: 2015-02-10 | Disposition: A | Discharge status: Home / Self Care | Payer: BC Managed Care – PPO | Attending: Emergency Medicine | Admitting: Emergency Medicine

## 2015-02-10 ENCOUNTER — Encounter (HOSPITAL_BASED_OUTPATIENT_CLINIC_OR_DEPARTMENT_OTHER): Payer: Self-pay | Admitting: Emergency Medicine

## 2015-02-10 DIAGNOSIS — Y998 Other external cause status: Secondary | ICD-10-CM | POA: Diagnosis not present

## 2015-02-10 DIAGNOSIS — Z8709 Personal history of other diseases of the respiratory system: Secondary | ICD-10-CM | POA: Diagnosis not present

## 2015-02-10 DIAGNOSIS — R011 Cardiac murmur, unspecified: Secondary | ICD-10-CM | POA: Diagnosis not present

## 2015-02-10 DIAGNOSIS — Y9389 Activity, other specified: Secondary | ICD-10-CM | POA: Diagnosis not present

## 2015-02-10 DIAGNOSIS — Z7951 Long term (current) use of inhaled steroids: Secondary | ICD-10-CM | POA: Insufficient documentation

## 2015-02-10 DIAGNOSIS — Z8719 Personal history of other diseases of the digestive system: Secondary | ICD-10-CM | POA: Insufficient documentation

## 2015-02-10 DIAGNOSIS — S50312A Abrasion of left elbow, initial encounter: Secondary | ICD-10-CM | POA: Insufficient documentation

## 2015-02-10 DIAGNOSIS — Y92410 Unspecified street and highway as the place of occurrence of the external cause: Secondary | ICD-10-CM | POA: Diagnosis not present

## 2015-02-10 DIAGNOSIS — S0990XA Unspecified injury of head, initial encounter: Secondary | ICD-10-CM | POA: Diagnosis present

## 2015-02-10 DIAGNOSIS — M1990 Unspecified osteoarthritis, unspecified site: Secondary | ICD-10-CM | POA: Insufficient documentation

## 2015-02-10 DIAGNOSIS — Z79899 Other long term (current) drug therapy: Secondary | ICD-10-CM | POA: Insufficient documentation

## 2015-02-10 NOTE — Discharge Instructions (Signed)

## 2015-02-10 NOTE — ED Notes (Signed)
Steady gait, denies needs or questions, out with family, denies pain, "just stiff/sore"
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
## 2015-02-11 ENCOUNTER — Telehealth: Payer: Self-pay | Admitting: Gastroenterology

## 2015-02-11 NOTE — Telephone Encounter (Signed)
No charge. 

## 2015-02-12 ENCOUNTER — Encounter: Payer: Self-pay | Admitting: Family Medicine

## 2015-02-14 ENCOUNTER — Ambulatory Visit (INDEPENDENT_AMBULATORY_CARE_PROVIDER_SITE_OTHER): Payer: BC Managed Care – PPO | Admitting: Family Medicine

## 2015-02-14 ENCOUNTER — Encounter: Payer: BC Managed Care – PPO | Admitting: Gastroenterology

## 2015-02-14 ENCOUNTER — Encounter: Payer: Self-pay | Admitting: Family Medicine

## 2015-02-14 DIAGNOSIS — M25511 Pain in right shoulder: Secondary | ICD-10-CM

## 2015-02-14 DIAGNOSIS — M6248 Contracture of muscle, other site: Secondary | ICD-10-CM

## 2015-02-14 DIAGNOSIS — M62838 Other muscle spasm: Secondary | ICD-10-CM

## 2015-02-14 MED ORDER — CYCLOBENZAPRINE HCL 10 MG PO TABS
10.0000 mg | ORAL_TABLET | Freq: Three times a day (TID) | ORAL | Status: DC | PRN
Start: 2015-02-14 — End: 2015-07-22

## 2015-02-14 MED ORDER — MELOXICAM 15 MG PO TABS: 15.0000 mg | ORAL_TABLET | Freq: Every day | ORAL | Status: DC

## 2015-02-14 NOTE — Patient Instructions (Signed)
Follow up as needed Start the Mobic once daily- w/ food- for inflammation Use the Flexeril (muscle relaxer) at night to improve spasm Alternate ice/heat Call with any questions or concerns If you want to join [place] at the new [place] office, any scheduled appointments will automatically transfer and we will see you at [address] [place] [address] [place], [place], [place] [address]  Hang in there!!!

## 2015-02-14 NOTE — Assessment & Plan Note (Signed)
New.  Bilateral.  Start scheduled NSAIDs, flexeril prn.  Reviewed supportive care and red flags that should prompt return.  Pt expressed understanding and is in agreement w/ plan.
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
## 2015-02-14 NOTE — Progress Notes (Signed)
Pre visit review using our clinic review tool, if applicable. No additional management support is needed unless otherwise documented below in the visit note. 
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
## 2015-03-07 ENCOUNTER — Ambulatory Visit: Payer: BC Managed Care – PPO | Attending: Family Medicine | Admitting: Physical Therapy

## 2015-03-07 DIAGNOSIS — R29898 Other symptoms and signs involving the musculoskeletal system: Secondary | ICD-10-CM | POA: Insufficient documentation

## 2015-03-07 DIAGNOSIS — M25511 Pain in right shoulder: Secondary | ICD-10-CM | POA: Diagnosis present
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
## 2015-03-11 ENCOUNTER — Other Ambulatory Visit: Payer: Self-pay | Admitting: Family Medicine

## 2015-03-17 ENCOUNTER — Ambulatory Visit: Payer: BC Managed Care – PPO | Admitting: Physical Therapy

## 2015-03-17 DIAGNOSIS — M25511 Pain in right shoulder: Secondary | ICD-10-CM | POA: Diagnosis not present

## 2015-03-17 DIAGNOSIS — R29898 Other symptoms and signs involving the musculoskeletal system: Secondary | ICD-10-CM
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
## 2015-03-23 ENCOUNTER — Ambulatory Visit: Payer: BC Managed Care – PPO | Admitting: Physical Therapy

## 2015-03-23 DIAGNOSIS — R29898 Other symptoms and signs involving the musculoskeletal system: Secondary | ICD-10-CM

## 2015-03-23 DIAGNOSIS — M25511 Pain in right shoulder: Secondary | ICD-10-CM
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
## 2015-03-29 ENCOUNTER — Encounter: Payer: Self-pay | Admitting: Gastroenterology

## 2015-03-29 ENCOUNTER — Ambulatory Visit (AMBULATORY_SURGERY_CENTER): Payer: BC Managed Care – PPO | Admitting: Gastroenterology

## 2015-03-29 VITALS — BP 129/69 | HR 58 | Temp 97.7°F | Resp 16 | Ht 63.0 in | Wt 207.0 lb

## 2015-03-29 DIAGNOSIS — Z1211 Encounter for screening for malignant neoplasm of colon: Secondary | ICD-10-CM | POA: Diagnosis not present

## 2015-03-29 DIAGNOSIS — Z8371 Family history of colonic polyps: Secondary | ICD-10-CM | POA: Diagnosis not present

## 2015-03-29 MED ORDER — SODIUM CHLORIDE 0.9 % IV SOLN: 500.0000 mL | INTRAVENOUS | Status: DC

## 2015-03-29 NOTE — Patient Instructions (Signed)
Handouts given for Diverticulosis and High Fiber.  YOU HAD AN ENDOSCOPIC PROCEDURE TODAY AT THE [facility]:   Refer to the procedure report that was given to you for any specific questions about what was found during the examination.  If the procedure report does not answer your questions, please call your gastroenterologist to clarify.  If you requested that your care partner not be given the details of your procedure findings, then the procedure report has been included in a sealed envelope for you to review at your convenience later.  YOU SHOULD EXPECT: Some feelings of bloating in the abdomen. Passage of more gas than usual.  Walking can help get rid of the air that was put into your GI tract during the procedure and reduce the bloating. If you had a lower endoscopy (such as a colonoscopy or flexible sigmoidoscopy) you may notice spotting of blood in your stool or on the toilet paper. If you underwent a bowel prep for your procedure, you may not have a normal bowel movement for a few days.  Please Note:  You might notice some irritation and congestion in your nose or some drainage.  This is from the oxygen used during your procedure.  There is no need for concern and it should clear up in a day or so.  SYMPTOMS TO REPORT IMMEDIATELY:   Following lower endoscopy (colonoscopy or flexible sigmoidoscopy):  Excessive amounts of blood in the stool  Significant tenderness or worsening of abdominal pains  Swelling of the abdomen that is new, acute  Fever of 100F or higher  For urgent or emergent issues, a gastroenterologist can be reached at any hour by calling [phone].   DIET: Your first meal following the procedure should be a small meal and then it is ok to progress to your normal diet. Heavy or fried foods are harder to digest and may make you feel nauseous or bloated.  Likewise, meals heavy in dairy and vegetables can increase bloating.  Drink plenty of fluids but you  should avoid alcoholic beverages for 24 hours.  ACTIVITY:  You should plan to take it easy for the rest of today and you should NOT DRIVE or use heavy machinery until tomorrow (because of the sedation medicines used during the test).    FOLLOW UP: Our staff will call the number listed on your records the next business day following your procedure to check on you and address any questions or concerns that you may have regarding the information given to you following your procedure. If we do not reach you, we will leave a message.  However, if you are feeling well and you are not experiencing any problems, there is no need to return our call.  We will assume that you have returned to your regular daily activities without incident.  If any biopsies were taken you will be contacted by phone or by letter within the next 1-3 weeks.  Please call us at [phone] if you have not heard about the biopsies in 3 weeks.    SIGNATURES/CONFIDENTIALITY: You and/or your care partner have signed paperwork which will be entered into your electronic medical record.  These signatures attest to the fact that that the information above on your After Visit Summary has been reviewed and is understood.  Full responsibility of the confidentiality of this discharge information lies with you and/or your care-partner.

## 2015-03-29 NOTE — Progress Notes (Signed)
Patient awakening,vss,report to rn 

## 2015-03-29 NOTE — Op Note (Signed)
[facility] [address] [place], [address]   COLONOSCOPY PROCEDURE REPORT  PATIENT: Sarah Galvan, Sarah Galvan  MR#: [id number] BIRTHDATE: 11/09/1960 , 53  yrs. old GENDER: female ENDOSCOPIST: Meryl DareMalcolm T Jahmier Willadsen, MD, [facility] REFERRED BY: Sheliah Hatchabori, Katherine E. MD PROCEDURE DATE:  03/29/2015 PROCEDURE:   Colonoscopy, screening First Screening Colonoscopy - Avg.  risk and is 50 yrs.  old or older - No.  Prior Negative Screening - Now for repeat screening. N/A  History of Adenoma - Now for follow-up colonoscopy & has been > or = to 3 yrs.  N/A  Polyps removed today? No Recommend repeat exam, <10 yrs? No ASA CLASS:   Class II INDICATIONS:Screening for colonic neoplasia and FH Colon Adenoma. MEDICATIONS: Monitored anesthesia care and Propofol 200 mg IV DESCRIPTION OF PROCEDURE:   After the risks benefits and alternatives of the procedure were thoroughly explained, informed consent was obtained.  The digital rectal exam revealed no abnormalities of the rectum.   The LB [id number]-H190 [id number]  endoscope was introduced through the anus and advanced to the cecum, which was identified by both the appendix and ileocecal valve. No adverse events experienced.   The quality of the prep was excellent. (Suprep was used)  The instrument was then slowly withdrawn as the colon was fully examined. Estimated blood loss is zero unless otherwise noted in this procedure report.    COLON FINDINGS: There was mild diverticulosis noted in the sigmoid colon.   The colonic mucosa appeared normal at the splenic flexure, in the transverse colon, rectum, descending colon, at the ileocecal valve, cecum, hepatic flexure, and in the ascending colon. Retroflexed views revealed no abnormalities. The time to cecum = 2.3 Withdrawal time = 8.4   The scope was withdrawn and the procedure completed. COMPLICATIONS: There were no immediate complications.  ENDOSCOPIC IMPRESSION: 1.   Mild diverticulosis in the sigmoid  colon 2.   The colon otherwise appeared normal  RECOMMENDATIONS: 1.  High fiber diet with liberal fluid intake. 2.  Continue current colorectal screening recommendations for "routine risk" patients with a repeat colonoscopy in 10 years.  eSigned:  Meryl DareMalcolm T Durga Saldarriaga, MD, [facility] 03/29/2015 3:18 PM

## 2015-03-30 ENCOUNTER — Telehealth: Payer: Self-pay | Admitting: *Deleted

## 2015-03-30 ENCOUNTER — Ambulatory Visit: Payer: BC Managed Care – PPO | Admitting: Physical Therapy

## 2015-03-30 DIAGNOSIS — R29898 Other symptoms and signs involving the musculoskeletal system: Secondary | ICD-10-CM

## 2015-03-30 DIAGNOSIS — M25511 Pain in right shoulder: Secondary | ICD-10-CM

## 2015-03-30 NOTE — Telephone Encounter (Signed)
Left message on f/u call 
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
## 2015-04-01 NOTE — Telephone Encounter (Signed)
Rx already sent.//AB/CMA

## 2015-04-06 ENCOUNTER — Ambulatory Visit: Payer: Self-pay | Admitting: Physical Therapy

## 2015-06-02 ENCOUNTER — Encounter: Payer: Self-pay | Admitting: Family Medicine

## 2015-06-03 ENCOUNTER — Other Ambulatory Visit: Payer: Self-pay | Admitting: Family Medicine

## 2015-06-03 ENCOUNTER — Encounter: Payer: Self-pay | Admitting: Family Medicine

## 2015-06-03 MED ORDER — DIPHENOXYLATE-ATROPINE 2.5-0.025 MG PO TABS: ORAL_TABLET | ORAL | Status: DC

## 2015-07-09 ENCOUNTER — Encounter: Payer: Self-pay | Admitting: Family Medicine

## 2015-07-11 ENCOUNTER — Other Ambulatory Visit: Payer: Self-pay | Admitting: Family Medicine

## 2015-07-11 MED ORDER — FLUCONAZOLE 150 MG PO TABS: 150.0000 mg | ORAL_TABLET | Freq: Once | ORAL | Status: DC

## 2015-07-12 NOTE — Telephone Encounter (Signed)
Medication filled to pharmacy as requested.   

## 2015-07-15 ENCOUNTER — Encounter: Payer: Self-pay | Admitting: Family Medicine

## 2015-07-22 ENCOUNTER — Ambulatory Visit (INDEPENDENT_AMBULATORY_CARE_PROVIDER_SITE_OTHER): Payer: BC Managed Care – PPO | Admitting: Family Medicine

## 2015-07-22 ENCOUNTER — Encounter: Payer: Self-pay | Admitting: Family Medicine

## 2015-07-22 VITALS — BP 124/78 | HR 73 | Temp 98.6°F | Wt 205.8 lb

## 2015-07-22 DIAGNOSIS — J0140 Acute pansinusitis, unspecified: Secondary | ICD-10-CM

## 2015-07-22 MED ORDER — AMOXICILLIN-POT CLAVULANATE 875-125 MG PO TABS: 1.0000 | ORAL_TABLET | Freq: Two times a day (BID) | ORAL | Status: DC

## 2015-07-22 MED ORDER — FLUTICASONE PROPIONATE 50 MCG/ACT NA SUSP: 2.0000 | Freq: Every day | NASAL | Status: AC

## 2015-07-22 NOTE — Progress Notes (Signed)
Pre visit review using our clinic review tool, if applicable. No additional management support is needed unless otherwise documented below in the visit note. 

## 2015-07-22 NOTE — Progress Notes (Signed)
  Subjective:     Sarah Galvan is a 55 y.o. female who presents for evaluation of symptoms of a URI. Symptoms include bilateral ear pressure/pain, congestion, cough described as productive, nasal congestion, post nasal drip, purulent nasal discharge, sinus pressure, sore throat and tooth pain. Onset of symptoms was 7 days ago, and has been gradually worsening since that time. Treatment to date: antihistamines and cough suppressants.  The following portions of the patient's history were reviewed and updated as appropriate: allergies, current medications, past family history, past medical history, past social history, past surgical history and problem list.  Review of Systems Pertinent items are noted in HPI.   Objective:    BP 124/78 mmHg  Pulse 73  Temp(Src) 98.6 F (37 C) (Oral)  Wt 205 lb 12.8 oz (93.35 kg)  SpO2 98%  LMP 06/01/2015 General appearance: alert, cooperative, appears stated age and no distress Ears: normal TM's and external ear canals both ears Nose: green discharge, moderate congestion, turbinates red, swollen, sinus tenderness bilateral Throat: abnormal findings: mild oropharyngeal erythema Neck: mild anterior cervical adenopathy, supple, symmetrical, trachea midline and thyroid not enlarged, symmetric, no tenderness/mass/nodules Lungs: clear to auscultation bilaterally Heart: S1, S2 normal   Assessment:    sinusitis and viral upper respiratory illness   Plan:    Suggested symptomatic OTC remedies. Nasal saline spray for congestion. Augmentin per orders. Nasal steroids per orders. Follow up as needed.

## 2015-07-22 NOTE — Patient Instructions (Signed)

## 2015-07-29 ENCOUNTER — Telehealth: Payer: Self-pay | Admitting: Family Medicine

## 2015-07-29 MED ORDER — FLUCONAZOLE 150 MG PO TABS: 150.0000 mg | ORAL_TABLET | Freq: Once | ORAL | Status: DC

## 2015-07-29 NOTE — Telephone Encounter (Signed)
Pt was treated by Dr. Laury Axon, ok to fill diflucan?

## 2015-07-29 NOTE — Telephone Encounter (Signed)
Medication filled to pharmacy as requested.   

## 2015-07-29 NOTE — Telephone Encounter (Signed)
Pt informed

## 2015-07-29 NOTE — Telephone Encounter (Signed)
Caller name: Self   Can be reached: [phone]  Pharmacy:  [id number] - [place], [place] - [address] AT [address] OF [address] [phone] (Phone) [phone] (Fax)        Reason for call: Patient request to have a Diflucan called in for her. States that she was treated with an antibiotic last week and now has a yeast infection

## 2015-07-29 NOTE — Telephone Encounter (Signed)
Ok for Diflucan 150mg daily x1 dose 

## 2015-08-19 ENCOUNTER — Encounter: Payer: Self-pay | Admitting: Family Medicine

## 2015-08-19 ENCOUNTER — Ambulatory Visit (INDEPENDENT_AMBULATORY_CARE_PROVIDER_SITE_OTHER): Payer: BC Managed Care – PPO | Admitting: Family Medicine

## 2015-08-19 VITALS — BP 124/84 | HR 68 | Temp 97.9°F | Resp 17 | Ht 63.0 in | Wt 204.0 lb

## 2015-08-19 DIAGNOSIS — R197 Diarrhea, unspecified: Secondary | ICD-10-CM | POA: Diagnosis not present

## 2015-08-19 DIAGNOSIS — J0110 Acute frontal sinusitis, unspecified: Secondary | ICD-10-CM

## 2015-08-19 MED ORDER — ONDANSETRON HCL 4 MG PO TABS: 4.0000 mg | ORAL_TABLET | Freq: Three times a day (TID) | ORAL | Status: DC | PRN

## 2015-08-19 MED ORDER — SULFAMETHOXAZOLE-TRIMETHOPRIM 800-160 MG PO TABS: 1.0000 | ORAL_TABLET | Freq: Two times a day (BID) | ORAL | Status: DC

## 2015-08-19 NOTE — Assessment & Plan Note (Signed)
New.  Suspect concurrent viral illness w/ her sinus infxn.  Start Immodium prn and Zofran prn.  Reviewed supportive care and red flags that should prompt return.  Pt expressed understanding and is in agreement w/ plan.

## 2015-08-19 NOTE — Progress Notes (Signed)
Pre visit review using our clinic review tool, if applicable. No additional management support is needed unless otherwise documented below in the visit note. 

## 2015-08-19 NOTE — Assessment & Plan Note (Signed)
Pt's sxs and PE consistent w/ sinus infxn.  Was tx'd 1 month ago w/ augmentin- will switch to Bactrim.  Continue allergy medication.  Reviewed supportive care and red flags that should prompt return.  Pt expressed understanding and is in agreement w/ plan.

## 2015-08-19 NOTE — Progress Notes (Signed)
   Subjective:    Patient ID: Sarah Galvan, female    DOB: 01-03-1961, [age] y.o.   MRN: [id number]  HPI URI- pt was seen 1 month ago and dx'd w/ sinusitis and tx'd w/ Augmentin.  Pt again developed sinus pain and HA on Tuesday.  Cough productive of green sputum.  Nasal congestion is 'stuck'.  Yesterday had 'terrible heart burn'.  This AM woke w/ nausea and diarrhea.  + chills.  Nausea improved w/ ginger ale and rest.   Review of Systems For ROS see HPI     Objective:   Physical Exam  Constitutional: She is oriented to person, place, and time. She appears well-developed and well-nourished. No distress.  HENT:  Head: Normocephalic and atraumatic.  Right Ear: Tympanic membrane normal.  Left Ear: Tympanic membrane normal.  Nose: Mucosal edema and rhinorrhea present. Right sinus exhibits maxillary sinus tenderness and frontal sinus tenderness. Left sinus exhibits maxillary sinus tenderness and frontal sinus tenderness.  Mouth/Throat: Uvula is midline and mucous membranes are normal. Posterior oropharyngeal erythema present. No oropharyngeal exudate.  Eyes: Conjunctivae and EOM are normal. Pupils are equal, round, and reactive to light.  Neck: Normal range of motion. Neck supple.  Cardiovascular: Normal rate, regular rhythm and normal heart sounds.   Pulmonary/Chest: Effort normal and breath sounds normal. No respiratory distress. She has no wheezes.  Abdominal: Soft. Bowel sounds are normal. She exhibits no distension. There is no tenderness. There is no rebound and no guarding.  Lymphadenopathy:    She has no cervical adenopathy.  Neurological: She is alert and oriented to person, place, and time.  Skin: Skin is warm and dry.  Psychiatric: She has a normal mood and affect. Her behavior is normal. Thought content normal.  Vitals reviewed.         Assessment & Plan:

## 2015-08-19 NOTE — Patient Instructions (Signed)
Follow up as needed Start the Bactrim twice daily- take w/ food Drink plenty of fluids REST! Zofran as needed for nausea Start a probiotic daily while on antibiotics Continue daily allergy medication Immodium as needed Call with any questions or concerns If you want to join us at the new [place] office, any scheduled appointments will automatically transfer and we will see you at [address], [place], [place] [phone] (OPENING 3/23) Hang in there!!

## 2015-08-21 ENCOUNTER — Encounter: Payer: Self-pay | Admitting: Family Medicine

## 2015-08-22 ENCOUNTER — Encounter: Payer: Self-pay | Admitting: Gastroenterology

## 2015-08-22 ENCOUNTER — Encounter: Payer: Self-pay | Admitting: Family Medicine

## 2015-08-23 MED ORDER — DOXYCYCLINE HYCLATE 100 MG PO CAPS: 100.0000 mg | ORAL_CAPSULE | Freq: Two times a day (BID) | ORAL | Status: DC

## 2015-11-19 ENCOUNTER — Encounter: Payer: Self-pay | Admitting: Gastroenterology

## 2015-11-21 NOTE — Telephone Encounter (Signed)
Britta MccreedyBarbara I checked with Aurea GraffJoan and she said to send this to you that you may need to call the pt to discuss and resolve this issue.

## 2015-12-14 ENCOUNTER — Encounter: Payer: Self-pay | Admitting: Family Medicine

## 2015-12-14 ENCOUNTER — Ambulatory Visit (INDEPENDENT_AMBULATORY_CARE_PROVIDER_SITE_OTHER): Payer: BC Managed Care – PPO | Admitting: Family Medicine

## 2015-12-14 VITALS — BP 122/82 | HR 72 | Temp 98.0°F | Resp 16 | Ht 63.0 in | Wt 198.5 lb

## 2015-12-14 DIAGNOSIS — Z0000 Encounter for general adult medical examination without abnormal findings: Secondary | ICD-10-CM

## 2015-12-14 LAB — BASIC METABOLIC PANEL
BUN: 18 mg/dL (ref 6–23)
CALCIUM: 9.4 mg/dL (ref 8.4–10.5)
CO2: 25 meq/L (ref 19–32)
CREATININE: 0.96 mg/dL (ref 0.40–1.20)
Chloride: 102 mEq/L (ref 96–112)
GFR: 64.23 mL/min (ref >60.00)
Glucose, Bld: 93 mg/dL (ref 70–99)
Potassium: 4.1 mEq/L (ref 3.5–5.1)
Sodium: 137 mEq/L (ref 135–145)

## 2015-12-14 LAB — CBC WITH DIFFERENTIAL/PLATELET
BASOS PCT: 0.3 % (ref 0.0–3.0)
Basophils Absolute: 0 10*3/uL (ref 0.0–0.1)
EOS ABS: 0.3 10*3/uL (ref 0.0–0.7)
EOS PCT: 5 % (ref 0.0–5.0)
HEMATOCRIT: 43.4 % (ref 36.0–46.0)
HEMOGLOBIN: 14.6 g/dL (ref 12.0–15.0)
LYMPHS ABS: 1.8 10*3/uL (ref 0.7–4.0)
Lymphocytes Relative: 26.2 % (ref 12.0–46.0)
MCHC: 33.7 g/dL (ref 30.0–36.0)
MCV: 85.8 fl (ref 78.0–100.0)
MONO ABS: 0.5 10*3/uL (ref 0.1–1.0)
Monocytes Relative: 7.1 % (ref 3.0–12.0)
Neutro Abs: 4.3 10*3/uL (ref 1.4–7.7)
Neutrophils Relative %: 61.4 % (ref 43.0–77.0)
Platelets: 264 10*3/uL (ref 150.0–400.0)
RBC: 5.06 Mil/uL (ref 3.87–5.11)
RDW: 14.1 % (ref 11.5–15.5)
WBC: 7 10*3/uL (ref 4.0–10.5)

## 2015-12-14 LAB — TSH: TSH: 3.02 u[IU]/mL (ref 0.35–4.50)

## 2015-12-14 LAB — HEPATIC FUNCTION PANEL
ALK PHOS: 82 U/L (ref 39–117)
ALT: 11 U/L (ref 0–35)
AST: 19 U/L (ref 0–37)
Albumin: 4.1 g/dL (ref 3.5–5.2)
BILIRUBIN TOTAL: 1.2 mg/dL (ref 0.2–1.2)
Bilirubin, Direct: 0.2 mg/dL (ref 0.0–0.3)
Total Protein: 7.7 g/dL (ref 6.0–8.3)

## 2015-12-14 LAB — LIPID PANEL
CHOL/HDL RATIO: 3
Cholesterol: 195 mg/dL (ref 0–200)
HDL: 55.9 mg/dL (ref >39.00)
LDL Cholesterol: 121 mg/dL — ABNORMAL HIGH (ref 0–99)
NONHDL: 139.19
Triglycerides: 89 mg/dL (ref 0.0–149.0)
VLDL: 17.8 mg/dL (ref 0.0–40.0)

## 2015-12-14 LAB — VITAMIN D 25 HYDROXY (VIT D DEFICIENCY, FRACTURES): VITD: 22.92 ng/mL — ABNORMAL LOW (ref 30.00–100.00)

## 2015-12-14 NOTE — Assessment & Plan Note (Signed)
Pt's PE WNL.  Due for mammo- pt plans to schedule.  UTD on colonoscopy, pap.  UTD on Tdap.  Check labs.  Anticipatory guidance provided.

## 2015-12-14 NOTE — Patient Instructions (Signed)
Follow up in 1 year or as needed We'll notify you of your lab results and make any changes if needed Continue to work on healthy diet and regular exercise- you look great!! Call and schedule your mammogram [phone] Call with any questions or concerns Have a great summer!!!

## 2015-12-14 NOTE — Progress Notes (Signed)
   Subjective:    Patient ID: Sarah Galvan, female    DOB: Oct 27, 1960, 55 y.o.   MRN: [id number]  HPI CPE- UTD on colonoscopy (due 2026), due for repeat mammo.  UTD on GYN (due for pap next year).  Pt is attempting to eat better and exercise more.   Review of Systems Patient reports no vision/ hearing changes, adenopathy,fever, weight change,  persistant/recurrent hoarseness , swallowing issues, chest pain, palpitations, edema, persistant/recurrent cough, hemoptysis, dyspnea (rest/exertional/paroxysmal nocturnal), gastrointestinal bleeding (melena, rectal bleeding), abdominal pain, significant heartburn, bowel changes, GU symptoms (dysuria, hematuria, incontinence), Gyn symptoms (abnormal  bleeding, pain),  syncope, focal weakness, memory loss, numbness & tingling, skin/hair/nail changes, abnormal bruising or bleeding, anxiety, or depression.     Objective:   Physical Exam General Appearance:    Alert, cooperative, no distress, appears stated age  Head:    Normocephalic, without obvious abnormality, atraumatic  Eyes:    PERRL, conjunctiva/corneas clear, EOM's intact, fundi    benign, both eyes  Ears:    Normal TM's and external ear canals, both ears  Nose:   Nares normal, septum midline, mucosa normal, no drainage    or sinus tenderness  Throat:   Lips, mucosa, and tongue normal; teeth and gums normal  Neck:   Supple, symmetrical, trachea midline, no adenopathy;    Thyroid: no enlargement/tenderness/nodules  Back:     Symmetric, no curvature, ROM normal, no CVA tenderness  Lungs:     Clear to auscultation bilaterally, respirations unlabored  Chest Wall:    No tenderness or deformity   Heart:    Regular rate and rhythm, S1 and S2 normal, no murmur, rub   or gallop  Breast Exam:    Deferred to mammo  Abdomen:     Soft, non-tender, bowel sounds active all four quadrants,    no masses, no organomegaly  Genitalia:    Deferred  Rectal:    Extremities:   Extremities normal,  atraumatic, no cyanosis or edema  Pulses:   2+ and symmetric all extremities  Skin:   Skin color, texture, turgor normal, no rashes or lesions  Lymph nodes:   Cervical, supraclavicular, and axillary nodes normal  Neurologic:   CNII-XII intact, normal strength, sensation and reflexes    throughout          Assessment & Plan:

## 2015-12-14 NOTE — Progress Notes (Signed)
Pre visit review using our clinic review tool, if applicable. No additional management support is needed unless otherwise documented below in the visit note. 

## 2015-12-15 ENCOUNTER — Other Ambulatory Visit: Payer: Self-pay | Admitting: General Practice

## 2015-12-15 MED ORDER — VITAMIN D (ERGOCALCIFEROL) 1.25 MG (50000 UNIT) PO CAPS: 50000.0000 [IU] | ORAL_CAPSULE | ORAL | Status: DC

## 2016-01-13 ENCOUNTER — Other Ambulatory Visit: Payer: Self-pay | Admitting: Family Medicine

## 2016-01-13 DIAGNOSIS — Z1231 Encounter for screening mammogram for malignant neoplasm of breast: Secondary | ICD-10-CM

## 2016-01-17 ENCOUNTER — Ambulatory Visit (HOSPITAL_BASED_OUTPATIENT_CLINIC_OR_DEPARTMENT_OTHER)
Admission: RE | Admit: 2016-01-17 | Discharge: 2016-01-17 | Disposition: A | Discharge status: Home / Self Care | Payer: BC Managed Care – PPO | Source: Ambulatory Visit | Attending: Family Medicine | Admitting: Family Medicine

## 2016-01-17 ENCOUNTER — Other Ambulatory Visit: Payer: Self-pay | Admitting: Family Medicine

## 2016-01-17 DIAGNOSIS — Z1231 Encounter for screening mammogram for malignant neoplasm of breast: Secondary | ICD-10-CM | POA: Diagnosis present

## 2016-01-24 ENCOUNTER — Ambulatory Visit (HOSPITAL_BASED_OUTPATIENT_CLINIC_OR_DEPARTMENT_OTHER): Payer: BC Managed Care – PPO

## 2016-02-13 ENCOUNTER — Other Ambulatory Visit: Payer: Self-pay | Admitting: Family Medicine

## 2016-02-16 ENCOUNTER — Ambulatory Visit (INDEPENDENT_AMBULATORY_CARE_PROVIDER_SITE_OTHER): Payer: BC Managed Care – PPO | Admitting: Family Medicine

## 2016-02-16 ENCOUNTER — Encounter: Payer: Self-pay | Admitting: Family Medicine

## 2016-02-16 VITALS — BP 123/82 | HR 86 | Temp 98.1°F | Resp 16 | Ht 63.0 in | Wt 200.4 lb

## 2016-02-16 DIAGNOSIS — R05 Cough: Secondary | ICD-10-CM

## 2016-02-16 DIAGNOSIS — J449 Chronic obstructive pulmonary disease, unspecified: Secondary | ICD-10-CM

## 2016-02-16 DIAGNOSIS — R059 Cough, unspecified: Secondary | ICD-10-CM

## 2016-02-16 MED ORDER — ALBUTEROL SULFATE (2.5 MG/3ML) 0.083% IN NEBU
2.5000 mg | INHALATION_SOLUTION | Freq: Once | RESPIRATORY_TRACT | Status: AC
  Administered 2016-02-16: 2.5 mg via RESPIRATORY_TRACT

## 2016-02-16 MED ORDER — ALBUTEROL SULFATE HFA 108 (90 BASE) MCG/ACT IN AERS: 2.0000 | INHALATION_SPRAY | Freq: Four times a day (QID) | RESPIRATORY_TRACT | 2 refills | Status: AC | PRN

## 2016-02-16 MED ORDER — AZITHROMYCIN 250 MG PO TABS: ORAL_TABLET | ORAL | 0 refills | Status: DC

## 2016-02-16 MED ORDER — GUAIFENESIN-CODEINE 100-10 MG/5ML PO SYRP: 10.0000 mL | ORAL_SOLUTION | Freq: Three times a day (TID) | ORAL | 0 refills | Status: DC | PRN

## 2016-02-16 NOTE — Assessment & Plan Note (Signed)
Pt's sxs of crackles and wheezing at night are concerning for ongoing infxn despite Augmentin.  This could indicate atypical infxn.  Start Zpack.  Cheratussin prn.  Albuterol as needed for cough/wheezing.  If no improvement will need CXR.  Reviewed supportive care and red flags that should prompt return.  Pt expressed understanding and is in agreement w/ plan.

## 2016-02-16 NOTE — Patient Instructions (Signed)
Follow up needed Continue the Augmentin as directed- take w/ food Add the Zpack as directed Use Mucinex DM for daytime cough, Cheratussin nightly- will cause drowsiness Drink plenty of fluids REST! Albuterol 2 puffs every 4-6 hrs as needed for cough/wheezing If no improvement, please let me know! Call with any questions or concerns Hang in there!!!

## 2016-02-16 NOTE — Progress Notes (Signed)
   Subjective:    Patient ID: Sarah LymeJudith Galvan, female    DOB: 06-20-1960, 55 y.o.   MRN: [id number]  HPI URI- sxs started 8 days ago w/ what pt thought was allergies.  Saturday developed 'gurgling in my chest.  It was like I was drowning'.  Went to UC and was dx'd w/ sinus infxn- started on Augmentin.  Pt reports sinus sxs are improving but chest congestion and breathing is worse.  Pt will have crackles and wheezing when lying on L side but not on R.  No fevers.  Cough is productive of thick sputum.  Sputum was initially green but now white.  No known sick contacts.   Review of Systems For ROS see HPI     Objective:   Physical Exam  Constitutional: She appears well-developed and well-nourished. No distress.  HENT:  Head: Normocephalic and atraumatic.  TMs normal bilaterally Mild nasal congestion Throat w/out erythema, edema, or exudate  Eyes: Conjunctivae and EOM are normal. Pupils are equal, round, and reactive to light.  Neck: Normal range of motion. Neck supple.  Cardiovascular: Normal rate, regular rhythm, normal heart sounds and intact distal pulses.   No murmur heard. Pulmonary/Chest: Effort normal and breath sounds normal. No respiratory distress. She has no wheezes.  + hacking cough  Lymphadenopathy:    She has no cervical adenopathy.  Vitals reviewed.         Assessment & Plan:

## 2016-02-16 NOTE — Progress Notes (Signed)
Pre visit review using our clinic review tool, if applicable. No additional management support is needed unless otherwise documented below in the visit note. 

## 2016-02-23 ENCOUNTER — Encounter: Payer: Self-pay | Admitting: Family Medicine

## 2016-02-23 MED ORDER — FLUCONAZOLE 150 MG PO TABS
150.0000 mg | ORAL_TABLET | Freq: Once | ORAL | 0 refills | Status: AC
Start: 2016-02-23 — End: 2016-02-23

## 2016-03-05 ENCOUNTER — Encounter: Payer: Self-pay | Admitting: Family Medicine
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
## 2016-03-20 ENCOUNTER — Telehealth: Payer: Self-pay | Admitting: Family Medicine

## 2016-03-20 NOTE — Telephone Encounter (Signed)
Patient states she was seen approximately 1 month ago for uri.  She states she is not feeling much better and is scheduled to have knee surgery soon.  Her surgeon recommended she follow-up with PCP regarding this.  Patient states she is off work [employer] account [employer]orrow for dental appt at [employer]11am.  She would like to know if she can be worked in [employer] account executivetomorrow afternoon.

## 2016-03-21 ENCOUNTER — Encounter: Payer: Self-pay | Admitting: Family Medicine

## 2016-03-21 ENCOUNTER — Ambulatory Visit (INDEPENDENT_AMBULATORY_CARE_PROVIDER_SITE_OTHER): Payer: BC Managed Care – PPO | Admitting: Family Medicine

## 2016-03-21 VITALS — BP 122/83 | HR 74 | Temp 98.2°F | Resp 16 | Ht 63.0 in | Wt 204.4 lb

## 2016-03-21 DIAGNOSIS — J0110 Acute frontal sinusitis, unspecified: Secondary | ICD-10-CM | POA: Diagnosis not present

## 2016-03-21 MED ORDER — DOXYCYCLINE HYCLATE 100 MG PO TABS: 100.0000 mg | ORAL_TABLET | Freq: Two times a day (BID) | ORAL | 0 refills | Status: DC

## 2016-03-21 NOTE — Patient Instructions (Signed)
Follow up as needed Start the Doxy twice daily- take w/ food Continue the allergy medication daily Drink plenty of fluids Call with any questions or concerns GOOD LUCK WITH SURGERY!!!

## 2016-03-21 NOTE — Assessment & Plan Note (Signed)
Recurrent issue for pt.  She has upcoming surgery and ortho is not comfortable w/ her having anesthesia w/ ongoing URI.  Start broad spectrum abx.  Reviewed supportive care and red flags that should prompt return.  Pt expressed understanding and is in agreement w/ plan.

## 2016-03-21 NOTE — Telephone Encounter (Signed)
Patient scheduled.

## 2016-03-21 NOTE — Progress Notes (Signed)
   Subjective:    Patient ID: Sarah Galvan, female    DOB: August 19, 1960, 55 y.o.   MRN: [id number]  HPI URI- pt has knee replacement scheduled for 11/1 and ortho was concerned about her ongoing congestion.  Pt reports she continues to have sinus pressure, nasal congestion, + tooth pain.  No fevers.  Cough has improved since Zpack at last visit.  No wheezing or SOB.  + hoarseness.  Taking Zyrtec daily.     Review of Systems For ROS see HPI     Objective:   Physical Exam  Constitutional: She appears well-developed and well-nourished. No distress.  HENT:  Head: Normocephalic and atraumatic.  Right Ear: Tympanic membrane normal.  Left Ear: Tympanic membrane normal.  Nose: Mucosal edema and rhinorrhea present. Right sinus exhibits maxillary sinus tenderness and frontal sinus tenderness. Left sinus exhibits maxillary sinus tenderness and frontal sinus tenderness.  Mouth/Throat: Uvula is midline and mucous membranes are normal. Posterior oropharyngeal erythema present. No oropharyngeal exudate.  Eyes: Conjunctivae and EOM are normal. Pupils are equal, round, and reactive to light.  Neck: Normal range of motion. Neck supple.  Cardiovascular: Normal rate, regular rhythm and normal heart sounds.   Pulmonary/Chest: Effort normal and breath sounds normal. No respiratory distress. She has no wheezes.  Lymphadenopathy:    She has no cervical adenopathy.  Vitals reviewed.         Assessment & Plan:

## 2016-03-21 NOTE — Progress Notes (Signed)
Pre visit review using our clinic review tool, if applicable. No additional management support is needed unless otherwise documented below in the visit note. 

## 2016-03-21 NOTE — Telephone Encounter (Signed)
Ok to use the 2pm that is open ( I placed a hold time with her name on it)

## 2016-03-22 ENCOUNTER — Encounter (HOSPITAL_COMMUNITY): Payer: Self-pay

## 2016-03-23 ENCOUNTER — Encounter (HOSPITAL_COMMUNITY)
Admission: RE | Admit: 2016-03-23 | Discharge: 2016-03-23 | Disposition: A | Discharge status: Home / Self Care | Payer: BC Managed Care – PPO | Source: Ambulatory Visit | Attending: Orthopedic Surgery | Admitting: Orthopedic Surgery

## 2016-03-23 ENCOUNTER — Encounter (HOSPITAL_COMMUNITY): Payer: Self-pay

## 2016-03-23 ENCOUNTER — Other Ambulatory Visit (HOSPITAL_COMMUNITY): Payer: BC Managed Care – PPO

## 2016-03-23 DIAGNOSIS — Z01812 Encounter for preprocedural laboratory examination: Secondary | ICD-10-CM | POA: Diagnosis present

## 2016-03-23 DIAGNOSIS — Z01818 Encounter for other preprocedural examination: Secondary | ICD-10-CM | POA: Insufficient documentation

## 2016-03-23 DIAGNOSIS — Z01810 Encounter for preprocedural cardiovascular examination: Secondary | ICD-10-CM | POA: Diagnosis present

## 2016-03-23 HISTORY — DX: Headache, unspecified: R51.9

## 2016-03-23 HISTORY — DX: Pneumonia, unspecified organism: J18.9

## 2016-03-23 HISTORY — DX: Hypothyroidism, unspecified: E03.9

## 2016-03-23 HISTORY — DX: Headache: R51

## 2016-03-23 LAB — ABO/RH: ABO/RH(D): O POS

## 2016-03-23 LAB — TYPE AND SCREEN
ABO/RH(D): O POS
Antibody Screen: NEGATIVE

## 2016-03-23 LAB — PROTIME-INR
INR: 0.96
Prothrombin Time: 12.7 seconds (ref 11.4–15.2)

## 2016-03-23 LAB — COMPREHENSIVE METABOLIC PANEL
ALBUMIN: 3.7 g/dL (ref 3.5–5.0)
ALK PHOS: 83 U/L (ref 38–126)
ALT: 15 U/L (ref 14–54)
ANION GAP: 9 (ref 5–15)
AST: 22 U/L (ref 15–41)
BILIRUBIN TOTAL: 0.6 mg/dL (ref 0.3–1.2)
BUN: 15 mg/dL (ref 6–20)
CALCIUM: 9.1 mg/dL (ref 8.9–10.3)
CO2: 25 mmol/L (ref 22–32)
CREATININE: 0.88 mg/dL (ref 0.44–1.00)
Chloride: 105 mmol/L (ref 101–111)
GFR calc Af Amer: >60 mL/min (ref >60)
GFR calc non Af Amer: >60 mL/min (ref >60)
GLUCOSE: 91 mg/dL (ref 65–99)
Potassium: 3.9 mmol/L (ref 3.5–5.1)
Sodium: 139 mmol/L (ref 135–145)
TOTAL PROTEIN: 7.5 g/dL (ref 6.5–8.1)

## 2016-03-23 LAB — URINALYSIS, ROUTINE W REFLEX MICROSCOPIC
Bilirubin Urine: NEGATIVE
Glucose, UA: NEGATIVE mg/dL
HGB URINE DIPSTICK: NEGATIVE
Ketones, ur: NEGATIVE mg/dL
LEUKOCYTES UA: NEGATIVE
NITRITE: NEGATIVE
Protein, ur: NEGATIVE mg/dL
SPECIFIC GRAVITY, URINE: 1.008 (ref 1.005–1.030)
pH: 5 (ref 5.0–8.0)

## 2016-03-23 LAB — CBC WITH DIFFERENTIAL/PLATELET
BASOS PCT: 1 %
Basophils Absolute: 0.1 10*3/uL (ref 0.0–0.1)
Eosinophils Absolute: 0.2 10*3/uL (ref 0.0–0.7)
Eosinophils Relative: 3 %
HEMATOCRIT: 41.9 % (ref 36.0–46.0)
HEMOGLOBIN: 14.4 g/dL (ref 12.0–15.0)
LYMPHS ABS: 1.7 10*3/uL (ref 0.7–4.0)
Lymphocytes Relative: 27 %
MCH: 29.2 pg (ref 26.0–34.0)
MCHC: 34.4 g/dL (ref 30.0–36.0)
MCV: 85 fL (ref 78.0–100.0)
MONOS PCT: 6 %
Monocytes Absolute: 0.3 10*3/uL (ref 0.1–1.0)
NEUTROS ABS: 3.9 10*3/uL (ref 1.7–7.7)
NEUTROS PCT: 63 %
Platelets: 278 10*3/uL (ref 150–400)
RBC: 4.93 MIL/uL (ref 3.87–5.11)
RDW: 13.1 % (ref 11.5–15.5)
WBC: 6.2 10*3/uL (ref 4.0–10.5)

## 2016-03-23 LAB — APTT: aPTT: 32 seconds (ref 24–36)

## 2016-03-23 LAB — SURGICAL PCR SCREEN
MRSA, PCR: NEGATIVE
Staphylococcus aureus: NEGATIVE

## 2016-03-23 NOTE — Progress Notes (Signed)
Sarah Galvan denies chest pain and shortness of breath.Patient has been recently been for bronchitis and sinus infection.  Patient denies fever or abnormal sinus drainage.

## 2016-03-23 NOTE — Pre-Procedure Instructions (Signed)
    Bosie ClosJudith Mckercher  03/23/2016     Your procedure is scheduled on Wednesday, April 04, 2016  [facility] 7:45 A.M.              Your surgery or procedure is scheduled for 9:45 AM   Call this number if you have problems the morning of surgery:[phone]                  For any other questions, please call [phone], Monday - Friday 8 AM - 4 PM.   Remember:   Do not eat food or drink liquids after midnight Tuesday, April 03, 2016  Take these medicines the morning of surgery with A SIP OF WATER : cetirizine (ZYRTEC),  levothyroxine (SYNTHROID), if needed: fluticasone (FLONASE) nasal spray for allergies, albuterol (PROVENTIL HFA;VENTOLIN HFA) inhaler for wheezing or shortness of breath ( bring inhaler in with you on day of surgery). Stop taking Aspirin, vitamins fish oil, and herbal medications such as MENOPAUTONIC.  Do not take any NSAIDs ie: Ibuprofen, Advil, Naproxen, BC and Goody Powder or any medication containing Aspirin; stop Wednesday, March 28, 2016.   Do not wear jewelry, make-up or nail polish.  Do not wear lotions, powders, or perfumes, or deodorant.  Do not shave 48 hours prior to surgery.    Do not bring valuables to the hospital.  [facility] is not responsible for any belongings or valuables.  Contacts, dentures or bridgework may not be worn into surgery.  Leave your suitcase in the car.  After surgery it may be brought to your room.  For patients admitted to the hospital, discharge time will be determined by your treatment team.  Special instructions: Shower the night before surgery and the morning of surgery with CHG.  Please read over the following fact sheets that you were given. Pain Booklet, Coughing and Deep Breathing, Blood Transfusion Information, Total Joint Packet, MRSA Information and Surgical Site Infection Prevention

## 2016-03-24 LAB — URINE CULTURE

## 2016-04-03 MED ORDER — TRANEXAMIC ACID 1000 MG/10ML IV SOLN
1000.0000 mg | INTRAVENOUS | Status: AC
  Administered 2016-04-04: 10:00:00 via INTRAVENOUS
  Administered 2016-04-04: 1000 mg via INTRAVENOUS
  Filled 2016-04-03: qty 10

## 2016-04-03 MED ORDER — CEFAZOLIN SODIUM-DEXTROSE 2-4 GM/100ML-% IV SOLN
2.0000 g | INTRAVENOUS | Status: AC
  Administered 2016-04-04: 2 g via INTRAVENOUS
  Filled 2016-04-03: qty 100

## 2016-04-03 MED ORDER — SODIUM CHLORIDE 0.9 % IV SOLN: INTRAVENOUS | Status: DC

## 2016-04-04 ENCOUNTER — Inpatient Hospital Stay (HOSPITAL_COMMUNITY): Payer: BC Managed Care – PPO

## 2016-04-04 ENCOUNTER — Inpatient Hospital Stay (HOSPITAL_COMMUNITY): Payer: BC Managed Care – PPO | Admitting: Anesthesiology

## 2016-04-04 ENCOUNTER — Inpatient Hospital Stay (HOSPITAL_COMMUNITY)
Admission: RE | Admit: 2016-04-04 | Discharge: 2016-04-06 | DRG: 470 | Disposition: A | Discharge status: Home Health | Payer: BC Managed Care – PPO | Source: Ambulatory Visit | Attending: Orthopedic Surgery | Admitting: Orthopedic Surgery

## 2016-04-04 ENCOUNTER — Other Ambulatory Visit (HOSPITAL_COMMUNITY): Payer: Self-pay | Admitting: Radiology

## 2016-04-04 ENCOUNTER — Encounter (HOSPITAL_COMMUNITY)
Admission: RE | Disposition: A | Discharge status: Home Health | Payer: Self-pay | Source: Ambulatory Visit | Attending: Orthopedic Surgery

## 2016-04-04 ENCOUNTER — Encounter (HOSPITAL_COMMUNITY): Payer: Self-pay | Admitting: Anesthesiology

## 2016-04-04 DIAGNOSIS — M25662 Stiffness of left knee, not elsewhere classified: Secondary | ICD-10-CM

## 2016-04-04 DIAGNOSIS — K219 Gastro-esophageal reflux disease without esophagitis: Secondary | ICD-10-CM | POA: Diagnosis present

## 2016-04-04 DIAGNOSIS — Z79899 Other long term (current) drug therapy: Secondary | ICD-10-CM

## 2016-04-04 DIAGNOSIS — E559 Vitamin D deficiency, unspecified: Secondary | ICD-10-CM | POA: Diagnosis present

## 2016-04-04 DIAGNOSIS — Z96611 Presence of right artificial shoulder joint: Secondary | ICD-10-CM | POA: Diagnosis present

## 2016-04-04 DIAGNOSIS — J302 Other seasonal allergic rhinitis: Secondary | ICD-10-CM | POA: Diagnosis present

## 2016-04-04 DIAGNOSIS — Z96659 Presence of unspecified artificial knee joint: Secondary | ICD-10-CM

## 2016-04-04 DIAGNOSIS — E039 Hypothyroidism, unspecified: Secondary | ICD-10-CM | POA: Diagnosis present

## 2016-04-04 DIAGNOSIS — R262 Difficulty in walking, not elsewhere classified: Secondary | ICD-10-CM

## 2016-04-04 DIAGNOSIS — R011 Cardiac murmur, unspecified: Secondary | ICD-10-CM | POA: Diagnosis present

## 2016-04-04 DIAGNOSIS — M1712 Unilateral primary osteoarthritis, left knee: Secondary | ICD-10-CM | POA: Diagnosis present

## 2016-04-04 HISTORY — PX: TOTAL KNEE ARTHROPLASTY: SHX125

## 2016-04-04 SURGERY — ARTHROPLASTY, KNEE, TOTAL
Anesthesia: Spinal | Site: Knee | Laterality: Left

## 2016-04-04 MED ORDER — DOCUSATE SODIUM 100 MG PO CAPS
100.0000 mg | ORAL_CAPSULE | Freq: Two times a day (BID) | ORAL | Status: DC
Start: 2016-04-04 — End: 2016-04-06
  Administered 2016-04-04 – 2016-04-06 (×5): 100 mg via ORAL
  Filled 2016-04-04 (×4): qty 1

## 2016-04-04 MED ORDER — LACTATED RINGERS IV SOLN
INTRAVENOUS | Status: DC
  Administered 2016-04-04 (×2): via INTRAVENOUS

## 2016-04-04 MED ORDER — BISACODYL 10 MG RE SUPP: 10.0000 mg | Freq: Every day | RECTAL | Status: DC | PRN

## 2016-04-04 MED ORDER — MIDAZOLAM HCL 5 MG/5ML IJ SOLN
INTRAMUSCULAR | Status: DC | PRN
  Administered 2016-04-04: 2 mg via INTRAVENOUS

## 2016-04-04 MED ORDER — OXYCODONE HCL 5 MG PO TABS
5.0000 mg | ORAL_TABLET | ORAL | Status: DC | PRN
  Administered 2016-04-05 – 2016-04-06 (×5): 10 mg via ORAL
  Filled 2016-04-04 (×5): qty 2

## 2016-04-04 MED ORDER — APIXABAN 2.5 MG PO TABS: ORAL_TABLET | ORAL | 0 refills | Status: DC

## 2016-04-04 MED ORDER — SODIUM CHLORIDE 0.9 % IJ SOLN
INTRAMUSCULAR | Status: AC
  Filled 2016-04-04: qty 10

## 2016-04-04 MED ORDER — PROMETHAZINE HCL 25 MG/ML IJ SOLN: 6.2500 mg | INTRAMUSCULAR | Status: DC | PRN

## 2016-04-04 MED ORDER — METOCLOPRAMIDE HCL 5 MG/ML IJ SOLN: 5.0000 mg | Freq: Three times a day (TID) | INTRAMUSCULAR | Status: DC | PRN

## 2016-04-04 MED ORDER — ROPIVACAINE HCL 7.5 MG/ML IJ SOLN
INTRAMUSCULAR | Status: DC | PRN
  Administered 2016-04-04: 20 mL via PERINEURAL

## 2016-04-04 MED ORDER — LEVOTHYROXINE SODIUM 75 MCG PO TABS
75.0000 ug | ORAL_TABLET | Freq: Every day | ORAL | Status: DC
  Administered 2016-04-05 – 2016-04-06 (×2): 75 ug via ORAL
  Filled 2016-04-04 (×2): qty 1

## 2016-04-04 MED ORDER — CHLORHEXIDINE GLUCONATE 4 % EX LIQD
60.0000 mL | Freq: Once | CUTANEOUS | Status: DC
  Filled 2016-04-04: qty 15

## 2016-04-04 MED ORDER — CEFAZOLIN SODIUM-DEXTROSE 2-4 GM/100ML-% IV SOLN
2.0000 g | Freq: Four times a day (QID) | INTRAVENOUS | Status: AC
  Administered 2016-04-04 (×2): 2 g via INTRAVENOUS
  Filled 2016-04-04 (×2): qty 100

## 2016-04-04 MED ORDER — LIDOCAINE HCL (CARDIAC) 20 MG/ML IV SOLN
INTRAVENOUS | Status: DC | PRN
  Administered 2016-04-04 (×2): 50 mg via INTRAVENOUS

## 2016-04-04 MED ORDER — BUPIVACAINE HCL 0.5 % IJ SOLN
INTRAMUSCULAR | Status: DC | PRN
  Administered 2016-04-04: 10 mL

## 2016-04-04 MED ORDER — MIDAZOLAM HCL 2 MG/2ML IJ SOLN
INTRAMUSCULAR | Status: AC
  Filled 2016-04-04: qty 2

## 2016-04-04 MED ORDER — ONDANSETRON HCL 4 MG/2ML IJ SOLN
INTRAMUSCULAR | Status: AC
  Filled 2016-04-04: qty 2

## 2016-04-04 MED ORDER — HYDROMORPHONE HCL 2 MG/ML IJ SOLN
INTRAMUSCULAR | Status: AC
  Filled 2016-04-04: qty 1

## 2016-04-04 MED ORDER — POLYETHYLENE GLYCOL 3350 17 G PO PACK: 17.0000 g | PACK | Freq: Every day | ORAL | Status: DC | PRN

## 2016-04-04 MED ORDER — BUPIVACAINE IN DEXTROSE 0.75-8.25 % IT SOLN
INTRATHECAL | Status: DC | PRN
  Administered 2016-04-04: 2 mL via INTRATHECAL

## 2016-04-04 MED ORDER — OXYCODONE-ACETAMINOPHEN 5-325 MG PO TABS: 1.0000 | ORAL_TABLET | ORAL | 0 refills | Status: DC | PRN

## 2016-04-04 MED ORDER — ONDANSETRON HCL 4 MG/2ML IJ SOLN: 4.0000 mg | Freq: Four times a day (QID) | INTRAMUSCULAR | Status: DC | PRN

## 2016-04-04 MED ORDER — LIDOCAINE 2% (20 MG/ML) 5 ML SYRINGE
INTRAMUSCULAR | Status: AC
  Filled 2016-04-04: qty 5

## 2016-04-04 MED ORDER — PHENOL 1.4 % MT LIQD: 1.0000 | OROMUCOSAL | Status: DC | PRN

## 2016-04-04 MED ORDER — DEXAMETHASONE SODIUM PHOSPHATE 10 MG/ML IJ SOLN
10.0000 mg | Freq: Once | INTRAMUSCULAR | Status: AC
  Administered 2016-04-05: 10 mg via INTRAVENOUS
  Filled 2016-04-04: qty 1

## 2016-04-04 MED ORDER — PHENYLEPHRINE HCL 10 MG/ML IJ SOLN
INTRAMUSCULAR | Status: DC | PRN
  Administered 2016-04-04: 40 ug via INTRAVENOUS
  Administered 2016-04-04 (×2): 80 ug via INTRAVENOUS

## 2016-04-04 MED ORDER — PROPOFOL 10 MG/ML IV BOLUS
INTRAVENOUS | Status: AC
  Filled 2016-04-04: qty 40

## 2016-04-04 MED ORDER — ACETAMINOPHEN 325 MG PO TABS: 650.0000 mg | ORAL_TABLET | Freq: Four times a day (QID) | ORAL | Status: DC | PRN

## 2016-04-04 MED ORDER — LACTATED RINGERS IV SOLN: INTRAVENOUS | Status: DC

## 2016-04-04 MED ORDER — METHOCARBAMOL 500 MG PO TABS: 500.0000 mg | ORAL_TABLET | Freq: Four times a day (QID) | ORAL | 0 refills | Status: DC | PRN

## 2016-04-04 MED ORDER — DIPHENHYDRAMINE HCL 12.5 MG/5ML PO ELIX
12.5000 mg | ORAL_SOLUTION | ORAL | Status: DC | PRN
  Administered 2016-04-05 (×3): 25 mg via ORAL
  Administered 2016-04-05: 12.5 mg via ORAL
  Administered 2016-04-06: 25 mg via ORAL
  Filled 2016-04-04 (×5): qty 10

## 2016-04-04 MED ORDER — MENTHOL 3 MG MT LOZG: 1.0000 | LOZENGE | OROMUCOSAL | Status: DC | PRN

## 2016-04-04 MED ORDER — FENTANYL CITRATE (PF) 100 MCG/2ML IJ SOLN
INTRAMUSCULAR | Status: AC
  Filled 2016-04-04: qty 2

## 2016-04-04 MED ORDER — ACETAMINOPHEN 650 MG RE SUPP: 650.0000 mg | Freq: Four times a day (QID) | RECTAL | Status: DC | PRN

## 2016-04-04 MED ORDER — CELECOXIB 200 MG PO CAPS
200.0000 mg | ORAL_CAPSULE | Freq: Two times a day (BID) | ORAL | Status: DC
  Administered 2016-04-04 – 2016-04-06 (×6): 200 mg via ORAL
  Filled 2016-04-04 (×4): qty 1

## 2016-04-04 MED ORDER — ONDANSETRON HCL 4 MG/2ML IJ SOLN
INTRAMUSCULAR | Status: DC | PRN
  Administered 2016-04-04: 4 mg via INTRAVENOUS

## 2016-04-04 MED ORDER — ONDANSETRON HCL 4 MG PO TABS: 4.0000 mg | ORAL_TABLET | Freq: Four times a day (QID) | ORAL | Status: DC | PRN

## 2016-04-04 MED ORDER — METHOCARBAMOL 500 MG PO TABS
500.0000 mg | ORAL_TABLET | Freq: Four times a day (QID) | ORAL | Status: DC | PRN
  Filled 2016-04-04: qty 1

## 2016-04-04 MED ORDER — BUPIVACAINE HCL (PF) 0.5 % IJ SOLN
INTRAMUSCULAR | Status: AC
  Filled 2016-04-04: qty 30

## 2016-04-04 MED ORDER — ZOLPIDEM TARTRATE 5 MG PO TABS: 5.0000 mg | ORAL_TABLET | Freq: Every evening | ORAL | Status: DC | PRN

## 2016-04-04 MED ORDER — SODIUM CHLORIDE 0.9 % IR SOLN
Status: DC | PRN
  Administered 2016-04-04: 1000 mL

## 2016-04-04 MED ORDER — ALUM & MAG HYDROXIDE-SIMETH 200-200-20 MG/5ML PO SUSP
30.0000 mL | ORAL | Status: DC | PRN
  Administered 2016-04-05 – 2016-04-06 (×2): 30 mL via ORAL
  Filled 2016-04-04 (×2): qty 30

## 2016-04-04 MED ORDER — HYDROMORPHONE HCL 2 MG/ML IJ SOLN
0.5000 mg | INTRAMUSCULAR | Status: DC | PRN
  Administered 2016-04-04 – 2016-04-05 (×4): 1 mg via INTRAVENOUS
  Filled 2016-04-04 (×4): qty 1

## 2016-04-04 MED ORDER — POTASSIUM CHLORIDE IN NACL 20-0.9 MEQ/L-% IV SOLN
INTRAVENOUS | Status: DC
  Administered 2016-04-04: 16:00:00 via INTRAVENOUS
  Filled 2016-04-04: qty 1000

## 2016-04-04 MED ORDER — ALBUTEROL SULFATE HFA 108 (90 BASE) MCG/ACT IN AERS: 2.0000 | INHALATION_SPRAY | Freq: Four times a day (QID) | RESPIRATORY_TRACT | Status: DC | PRN

## 2016-04-04 MED ORDER — FENTANYL CITRATE (PF) 100 MCG/2ML IJ SOLN
INTRAMUSCULAR | Status: DC | PRN
  Administered 2016-04-04: 50 ug via INTRAVENOUS

## 2016-04-04 MED ORDER — PROPOFOL 500 MG/50ML IV EMUL
INTRAVENOUS | Status: DC | PRN
  Administered 2016-04-04: 25 ug/kg/min via INTRAVENOUS

## 2016-04-04 MED ORDER — MEPERIDINE HCL 25 MG/ML IJ SOLN: 6.2500 mg | INTRAMUSCULAR | Status: DC | PRN

## 2016-04-04 MED ORDER — MAGNESIUM CITRATE PO SOLN: 1.0000 | Freq: Once | ORAL | Status: DC | PRN

## 2016-04-04 MED ORDER — SODIUM CHLORIDE 0.9 % IJ SOLN
INTRAMUSCULAR | Status: DC | PRN
  Administered 2016-04-04: 40 mL

## 2016-04-04 MED ORDER — BUPIVACAINE LIPOSOME 1.3 % IJ SUSP
20.0000 mL | INTRAMUSCULAR | Status: AC
  Administered 2016-04-04: 20 mL
  Filled 2016-04-04: qty 20

## 2016-04-04 MED ORDER — APIXABAN 2.5 MG PO TABS
2.5000 mg | ORAL_TABLET | Freq: Two times a day (BID) | ORAL | Status: DC
  Administered 2016-04-05 – 2016-04-06 (×3): 2.5 mg via ORAL
  Filled 2016-04-04 (×3): qty 1

## 2016-04-04 MED ORDER — METHOCARBAMOL 1000 MG/10ML IJ SOLN
500.0000 mg | Freq: Four times a day (QID) | INTRAVENOUS | Status: DC | PRN
  Filled 2016-04-04: qty 5

## 2016-04-04 MED ORDER — ONDANSETRON HCL 4 MG PO TABS: 4.0000 mg | ORAL_TABLET | Freq: Three times a day (TID) | ORAL | 0 refills | Status: DC | PRN

## 2016-04-04 MED ORDER — METOCLOPRAMIDE HCL 5 MG PO TABS: 5.0000 mg | ORAL_TABLET | Freq: Three times a day (TID) | ORAL | Status: DC | PRN

## 2016-04-04 MED ORDER — HYDROMORPHONE HCL 1 MG/ML IJ SOLN
0.2500 mg | INTRAMUSCULAR | Status: DC | PRN
  Administered 2016-04-04: 0.5 mg via INTRAVENOUS

## 2016-04-04 SURGICAL SUPPLY — 72 items
APL SKNCLS STERI-STRIP NONHPOA (GAUZE/BANDAGES/DRESSINGS) ×1
BANDAGE ACE 4X5 VEL STRL LF (GAUZE/BANDAGES/DRESSINGS) ×3 IMPLANT
BANDAGE ACE 6X5 VEL STRL LF (GAUZE/BANDAGES/DRESSINGS) ×3 IMPLANT
BANDAGE ESMARK 6X9 LF (GAUZE/BANDAGES/DRESSINGS) ×1 IMPLANT
BENZOIN TINCTURE PRP APPL 2/3 (GAUZE/BANDAGES/DRESSINGS) ×3 IMPLANT
BLADE SAG 18X100X1.27 (BLADE) ×6 IMPLANT
BNDG CMPR 9X6 STRL LF SNTH (GAUZE/BANDAGES/DRESSINGS) ×1
BNDG ESMARK 6X9 LF (GAUZE/BANDAGES/DRESSINGS) ×3
BOWL SMART MIX CTS (DISPOSABLE) ×3 IMPLANT
CAPT KNEE TOTAL 3 ×2 IMPLANT
CEMENT BONE SIMPLEX SPEEDSET (Cement) ×6 IMPLANT
CLOSURE STERI-STRIP 1/2X4 (GAUZE/BANDAGES/DRESSINGS) ×1
CLOSURE WOUND 1/2 X4 (GAUZE/BANDAGES/DRESSINGS) ×2
CLSR STERI-STRIP ANTIMIC 1/2X4 (GAUZE/BANDAGES/DRESSINGS) ×1 IMPLANT
COVER SURGICAL LIGHT HANDLE (MISCELLANEOUS) ×3 IMPLANT
CUFF TOURNIQUET SINGLE 34IN LL (TOURNIQUET CUFF) ×3 IMPLANT
DRAPE EXTREMITY T 121X128X90 (DRAPE) ×2 IMPLANT
DRAPE HALF SHEET 40X57 (DRAPES) ×3 IMPLANT
DRAPE IMP U-DRAPE 54X76 (DRAPES) ×3 IMPLANT
DRAPE PROXIMA HALF (DRAPES) ×3 IMPLANT
DRAPE U-SHAPE 47X51 STRL (DRAPES) ×3 IMPLANT
DRSG AQUACEL AG ADV 3.5X10 (GAUZE/BANDAGES/DRESSINGS) ×3 IMPLANT
DURAPREP 26ML APPLICATOR (WOUND CARE) ×6 IMPLANT
ELECT CAUTERY BLADE 6.4 (BLADE) ×3 IMPLANT
ELECT REM PT RETURN 9FT ADLT (ELECTROSURGICAL) ×3
ELECTRODE REM PT RTRN 9FT ADLT (ELECTROSURGICAL) ×1 IMPLANT
EVACUATOR 1/8 PVC DRAIN (DRAIN) IMPLANT
FACESHIELD WRAPAROUND (MASK) ×6 IMPLANT
FACESHIELD WRAPAROUND OR TEAM (MASK) ×2 IMPLANT
GLOVE BIO SURGEON STRL SZ8 (GLOVE) ×2 IMPLANT
GLOVE BIOGEL PI IND STRL 6.5 (GLOVE) IMPLANT
GLOVE BIOGEL PI IND STRL 7.0 (GLOVE) ×1 IMPLANT
GLOVE BIOGEL PI IND STRL 8 (GLOVE) ×2 IMPLANT
GLOVE BIOGEL PI INDICATOR 6.5 (GLOVE) ×2
GLOVE BIOGEL PI INDICATOR 7.0 (GLOVE) ×2
GLOVE BIOGEL PI INDICATOR 8 (GLOVE) ×4
GLOVE ORTHO TXT STRL SZ7.5 (GLOVE) ×3 IMPLANT
GLOVE SURG ORTHO 7.0 STRL STRW (GLOVE) ×3 IMPLANT
GLOVE SURG SS PI 8.0 STRL IVOR (GLOVE) ×4 IMPLANT
GOWN STRL REUS W/ TWL LRG LVL3 (GOWN DISPOSABLE) ×2 IMPLANT
GOWN STRL REUS W/ TWL XL LVL3 (GOWN DISPOSABLE) ×1 IMPLANT
GOWN STRL REUS W/TWL LRG LVL3 (GOWN DISPOSABLE) ×6
GOWN STRL REUS W/TWL XL LVL3 (GOWN DISPOSABLE) ×6
HANDPIECE INTERPULSE COAX TIP (DISPOSABLE) ×3
IMMOBILIZER KNEE 22 UNIV (SOFTGOODS) ×3 IMPLANT
IMMOBILIZER KNEE 24 THIGH 36 (MISCELLANEOUS) IMPLANT
IMMOBILIZER KNEE 24 UNIV (MISCELLANEOUS)
KIT BASIN OR (CUSTOM PROCEDURE TRAY) ×3 IMPLANT
KIT ROOM TURNOVER OR (KITS) ×3 IMPLANT
MANIFOLD NEPTUNE II (INSTRUMENTS) ×3 IMPLANT
NEEDLE 18GX1X1/2 (RX/OR ONLY) (NEEDLE) ×3 IMPLANT
NEEDLE HYPO 25GX1X1/2 BEV (NEEDLE) ×3 IMPLANT
NS IRRIG 1000ML POUR BTL (IV SOLUTION) ×3 IMPLANT
PACK TOTAL JOINT (CUSTOM PROCEDURE TRAY) ×3 IMPLANT
PACK UNIVERSAL I (CUSTOM PROCEDURE TRAY) ×3 IMPLANT
PAD ARMBOARD 7.5X6 YLW CONV (MISCELLANEOUS) ×6 IMPLANT
SET HNDPC FAN SPRY TIP SCT (DISPOSABLE) ×1 IMPLANT
STRIP CLOSURE SKIN 1/2X4 (GAUZE/BANDAGES/DRESSINGS) ×4 IMPLANT
SUCTION FRAZIER HANDLE 10FR (MISCELLANEOUS) ×2
SUCTION TUBE FRAZIER 10FR DISP (MISCELLANEOUS) ×1 IMPLANT
SUT MNCRL AB 4-0 PS2 18 (SUTURE) ×3 IMPLANT
SUT VIC AB 0 CT1 27 (SUTURE) ×6
SUT VIC AB 0 CT1 27XBRD ANBCTR (SUTURE) IMPLANT
SUT VIC AB 1 CTX 36 (SUTURE) ×6
SUT VIC AB 1 CTX36XBRD ANBCTR (SUTURE) ×2 IMPLANT
SUT VIC AB 2-0 CT1 27 (SUTURE) ×9
SUT VIC AB 2-0 CT1 TAPERPNT 27 (SUTURE) ×3 IMPLANT
SYR 50ML LL SCALE MARK (SYRINGE) ×3 IMPLANT
SYR CONTROL 10ML LL (SYRINGE) ×3 IMPLANT
TOWEL OR 17X24 6PK STRL BLUE (TOWEL DISPOSABLE) ×3 IMPLANT
TOWEL OR 17X26 10 PK STRL BLUE (TOWEL DISPOSABLE) ×3 IMPLANT
WATER STERILE IRR 1000ML POUR (IV SOLUTION) ×3 IMPLANT

## 2016-04-04 NOTE — Anesthesia Preprocedure Evaluation (Addendum)
Anesthesia Evaluation  Patient identified by MRN, date of birth, ID band Patient awake    Reviewed: Allergy & Precautions, NPO status , Patient's Chart, lab work & pertinent test results  Airway Mallampati: II  TM Distance: >3 FB Neck ROM: Full    Dental  (+) Teeth Intact, Dental Advisory Given   Pulmonary    breath sounds clear to auscultation       Cardiovascular negative cardio ROS   Rhythm:Regular Rate:Normal     Neuro/Psych  Headaches,  Neuromuscular disease negative psych ROS   GI/Hepatic Neg liver ROS, GERD  ,  Endo/Other  Hypothyroidism On replacement  Renal/GU negative Renal ROS  negative genitourinary   Musculoskeletal  (+) Arthritis , Osteoarthritis,    Abdominal   Peds negative pediatric ROS (+)  Hematology negative hematology ROS (+)   Anesthesia Other Findings   Reproductive/Obstetrics negative OB ROS                           Anesthesia Physical Anesthesia Plan  ASA: II  Anesthesia Plan: Spinal   Post-op Pain Management:  Regional for Post-op pain   Induction: Intravenous  Airway Management Planned: Natural Airway  Additional Equipment:   Intra-op Plan:   Post-operative Plan:   Informed Consent: I have reviewed the patients History and Physical, chart, labs and discussed the procedure including the risks, benefits and alternatives for the proposed anesthesia with the patient or authorized representative who has indicated his/her understanding and acceptance.     Plan Discussed with: CRNA  Anesthesia Plan Comments:        Anesthesia Quick Evaluation

## 2016-04-04 NOTE — Progress Notes (Signed)
Orthopedic Tech Progress Note Patient Details:  Sarah LymeJudith Galvan 01-08-1961 [id number]  CPM Left Knee CPM Left Knee: On Left Knee Flexion (Degrees): 0 Left Knee Extension (Degrees): 0 Additional Comments: trapeze bar patient helper   Nikki DomCrawford, Deija Buhrman 04/04/2016, 12:39 PM Left knee flexion:90 degrees; viewed order from doctor's order list

## 2016-04-04 NOTE — Interval H&P Note (Signed)
History and Physical Interval Note:  04/04/2016 8:32 AM  Sarah Galvan  has presented today for surgery, with the diagnosis of djd left knee  The various methods of treatment have been discussed with the patient and family. After consideration of risks, benefits and other options for treatment, the patient has consented to  Procedure(s): LEFT TOTAL KNEE ARTHROPLASTY (Left) as a surgical intervention .  The patient's history has been reviewed, patient examined, no change in status, stable for surgery.  I have reviewed the patient's chart and labs.  Questions were answered to the patient's satisfaction.     Loreta Aveaniel F Junior Kenedy

## 2016-04-04 NOTE — Anesthesia Procedure Notes (Signed)
Anesthesia Regional Block:  Adductor canal block  Pre-Anesthetic Checklist: ,, timeout performed, Correct Patient, Correct Site, Correct Laterality, Correct Procedure, Correct Position, site marked, Risks and benefits discussed,  Surgical consent,  Pre-op evaluation,  At surgeon's request and post-op pain management  Laterality: Left  Prep: chloraprep       Needles:  Injection technique: Single-shot  Needle Type: Echogenic Needle     Needle Length: 9cm 9 cm Needle Gauge: 21 and 21 G    Additional Needles:  Procedures: ultrasound guided (picture in chart) Adductor canal block Narrative:  Start time: 04/04/2016 9:34 AM End time: 04/04/2016 9:37 AM Injection made incrementally with aspirations every 5 mL.  Performed by: Personally  Anesthesiologist: Shona SimpsonHOLLIS, Sabas Frett D  Additional Notes: No immediate complications noted. Pt tolerated well.

## 2016-04-04 NOTE — Discharge Instructions (Signed)
Information on my medicine - ELIQUIS® (apixaban) ° °This medication education was reviewed with me or my healthcare representative as part of my discharge preparation.  The pharmacist that spoke with me during my hospital stay was:  Keelin Sheridan J. Noa Galvao, PharmD. ° °Why was Eliquis® prescribed for you? °Eliquis® was prescribed for you to reduce the risk of blood clots forming after orthopedic surgery.   ° °What do You need to know about Eliquis®? °Take your Eliquis® TWICE DAILY - one tablet in the morning and one tablet in the evening with or without food.  It would be best to take the dose about the same time each day. ° °If you have difficulty swallowing the tablet whole please discuss with your pharmacist how to take the medication safely. ° °Take Eliquis® exactly as prescribed by your doctor and DO NOT stop taking Eliquis® without talking to the doctor who prescribed the medication.  Stopping without other medication to take the place of Eliquis® may increase your risk of developing a clot. ° °After discharge, you should have regular check-up appointments with your healthcare provider that is prescribing your Eliquis®. ° °What do you do if you miss a dose? °If a dose of ELIQUIS® is not taken at the scheduled time, take it as soon as possible on the same day and twice-daily administration should be resumed.  The dose should not be doubled to make up for a missed dose.  Do not take more than one tablet of ELIQUIS at the same time. ° °Important Safety Information °A possible side effect of Eliquis® is bleeding. You should call your healthcare provider right away if you experience any of the following: °? Bleeding from an injury or your nose that does not stop. °? Unusual colored urine (red or dark brown) or unusual colored stools (red or black). °? Unusual bruising for unknown reasons. °? A serious fall or if you hit your head (even if there is no bleeding). ° °Some medicines may interact with Eliquis® and might  increase your risk of bleeding or clotting while on Eliquis®. To help avoid this, consult your healthcare provider or pharmacist prior to using any new prescription or non-prescription medications, including herbals, vitamins, non-steroidal anti-inflammatory drugs (NSAIDs) and supplements. ° °This website has more information on Eliquis® (apixaban): http://www.eliquis.com/eliquis/homeINSTRUCTIONS AFTER JOINT REPLACEMENT  ° °o Remove items at home which could result in a fall. This includes throw rugs or furniture in walking pathways °o ICE to the affected joint every three hours while awake for 30 minutes at a time, for at least the first 3-5 days, and then as needed for pain and swelling.  Continue to use ice for pain and swelling. You may notice swelling that will progress down to the foot and ankle.  This is normal after surgery.  Elevate your leg when you are not up walking on it.   °o Continue to use the breathing machine you got in the hospital (incentive spirometer) which will help keep your temperature down.  It is common for your temperature to cycle up and down following surgery, especially at night when you are not up moving around and exerting yourself.  The breathing machine keeps your lungs expanded and your temperature down. ° ° °DIET:  As you were doing prior to hospitalization, we recommend a well-balanced diet. ° °DRESSING / WOUND CARE / SHOWERING ° °Keep the surgical dressing until follow up.  The dressing is water proof, so you can shower without any extra covering.  IF THE   DRESSING FALLS OFF or the wound gets wet inside, change the dressing with sterile gauze.  Please use good hand washing techniques before changing the dressing.  Do not use any lotions or creams on the incision until instructed by your surgeon.   ° °ACTIVITY ° °o Increase activity slowly as tolerated, but follow the weight bearing instructions below.   °o No driving for 6 weeks or until further direction given by your  physician.  You cannot drive while taking narcotics.  °o No lifting or carrying greater than 10 lbs. until further directed by your surgeon. °o Avoid periods of inactivity such as sitting longer than an hour when not asleep. This helps prevent blood clots.  °o You may return to work once you are authorized by your doctor.  ° ° ° °WEIGHT BEARING  ° °Weight bearing as tolerated with assist device (walker, cane, etc) as directed, use it as long as suggested by your surgeon or therapist, typically at least 4-6 weeks. ° ° °EXERCISES ° °Results after joint replacement surgery are often greatly improved when you follow the exercise, range of motion and muscle strengthening exercises prescribed by your doctor. Safety measures are also important to protect the joint from further injury. Any time any of these exercises cause you to have increased pain or swelling, decrease what you are doing until you are comfortable again and then slowly increase them. If you have problems or questions, call your caregiver or physical therapist for advice.  ° °Rehabilitation is important following a joint replacement. After just a few days of immobilization, the muscles of the leg can become weakened and shrink (atrophy).  These exercises are designed to build up the tone and strength of the thigh and leg muscles and to improve motion. Often times heat used for twenty to thirty minutes before working out will loosen up your tissues and help with improving the range of motion but do not use heat for the first two weeks following surgery (sometimes heat can increase post-operative swelling).  ° °These exercises can be done on a training (exercise) mat, on the floor, on a table or on a bed. Use whatever works the best and is most comfortable for you.    Use music or television while you are exercising so that the exercises are a pleasant break in your day. This will make your life better with the exercises acting as a break in your routine that  you can look forward to.   Perform all exercises about fifteen times, three times per day or as directed.  You should exercise both the operative leg and the other leg as well. ° °Exercises include: °  °• Quad Sets - Tighten up the muscle on the front of the thigh (Quad) and hold for 5-10 seconds.   °• Straight Leg Raises - With your knee straight (if you were given a brace, keep it on), lift the leg to 60 degrees, hold for 3 seconds, and slowly lower the leg.  Perform this exercise against resistance later as your leg gets stronger.  °• Leg Slides: Lying on your back, slowly slide your foot toward your buttocks, bending your knee up off the floor (only go as far as is comfortable). Then slowly slide your foot back down until your leg is flat on the floor again.  °• Angel Wings: Lying on your back spread your legs to the side as far apart as you can without causing discomfort.  °• Hamstring Strength:  Lying on   your back, push your heel against the floor with your leg straight by tightening up the muscles of your buttocks.  Repeat, but this time bend your knee to a comfortable angle, and push your heel against the floor.  You may put a pillow under the heel to make it more comfortable if necessary.  ° °A rehabilitation program following joint replacement surgery can speed recovery and prevent re-injury in the future due to weakened muscles. Contact your doctor or a physical therapist for more information on knee rehabilitation.  ° ° °CONSTIPATION ° °Constipation is defined medically as fewer than three stools per week and severe constipation as less than one stool per week.  Even if you have a regular bowel pattern at home, your normal regimen is likely to be disrupted due to multiple reasons following surgery.  Combination of anesthesia, postoperative narcotics, change in appetite and fluid intake all can affect your bowels.  ° °YOU MUST use at least one of the following options; they are listed in order of  increasing strength to get the job done.  They are all available over the counter, and you may need to use some, POSSIBLY even all of these options:   ° °Drink plenty of fluids (prune juice may be helpful) and high fiber foods °Colace 100 mg by mouth twice a day  °Senokot for constipation as directed and as needed Dulcolax (bisacodyl), take with full glass of water  °Miralax (polyethylene glycol) once or twice a day as needed. ° °If you have tried all these things and are unable to have a bowel movement in the first 3-4 days after surgery call either your surgeon or your primary doctor.   ° °If you experience loose stools or diarrhea, hold the medications until you stool forms back up.  If your symptoms do not get better within 1 week or if they get worse, check with your doctor.  If you experience "the worst abdominal pain ever" or develop nausea or vomiting, please contact the office immediately for further recommendations for treatment. ° ° °ITCHING:  If you experience itching with your medications, try taking only a single pain pill, or even half a pain pill at a time.  You can also use Benadryl over the counter for itching or also to help with sleep.  ° °TED HOSE STOCKINGS:  Use stockings on both legs until for at least 2 weeks or as directed by physician office. They may be removed at night for sleeping. ° °MEDICATIONS:  See your medication summary on the “After Visit Summary” that nursing will review with you.  You may have some home medications which will be placed on hold until you complete the course of blood thinner medication.  It is important for you to complete the blood thinner medication as prescribed. ° °PRECAUTIONS:  If you experience chest pain or shortness of breath - call 911 immediately for transfer to the hospital emergency department.  ° °If you develop a fever greater that 101 F, purulent drainage from wound, increased redness or drainage from wound, foul odor from the wound/dressing, or  calf pain - CONTACT YOUR SURGEON.   °                                                °FOLLOW-UP APPOINTMENTS:  If you do not already have a post-op appointment, please   call the office for an appointment to be seen by your surgeon.  Guidelines for how soon to be seen are listed in your “After Visit Summary”, but are typically between 1-4 weeks after surgery. ° °OTHER INSTRUCTIONS:  ° °Knee Replacement:  Do not place pillow under knee, focus on keeping the knee straight while resting. CPM instructions: 0-90 degrees, 2 hours in the morning, 2 hours in the afternoon, and 2 hours in the evening. Place foam block, curve side up under heel at all times except when in CPM or when walking.  DO NOT modify, tear, cut, or change the foam block in any way. ° °MAKE SURE YOU:  °• Understand these instructions.  °• Get help right away if you are not doing well or get worse.  ° ° °Thank you for letting us be a part of your medical care team.  It is a privilege we respect greatly.  We hope these instructions will help you stay on track for a fast and full recovery!  ° ° °

## 2016-04-04 NOTE — Anesthesia Postprocedure Evaluation (Signed)
Anesthesia Post Note  Patient: Sarah Galvan  Procedure(s) Performed: Procedure(s) (LRB): LEFT TOTAL KNEE ARTHROPLASTY (Left)  Patient location during evaluation: PACU Anesthesia Type: Spinal Level of consciousness: oriented and awake and alert Pain management: pain level controlled Vital Signs Assessment: post-procedure vital signs reviewed and stable Respiratory status: spontaneous breathing, respiratory function stable and patient connected to nasal cannula oxygen Cardiovascular status: blood pressure returned to baseline and stable Postop Assessment: no headache, no backache and spinal receding Anesthetic complications: no    Last Vitals:  Vitals:   04/04/16 1430 04/04/16 1459  BP: (!) 144/80 126/79  Pulse:    Resp:    Temp:  36.9 C    Last Pain:  Vitals:   04/04/16 1459  TempSrc: Oral  PainSc:                  Shelton SilvasKevin D Hollis

## 2016-04-04 NOTE — Anesthesia Procedure Notes (Signed)
Spinal  Patient location during procedure: OR Start time: 04/04/2016 9:58 AM End time: 04/04/2016 10:00 AM Staffing Anesthesiologist: Suella Broad D Performed: anesthesiologist  Preanesthetic Checklist Completed: patient identified, site marked, surgical consent, pre-op evaluation, timeout performed, IV checked, risks and benefits discussed and monitors and equipment checked Spinal Block Patient position: sitting Prep: Betadine Patient monitoring: heart rate, continuous pulse ox, blood pressure and cardiac monitor Approach: midline Location: L4-5 Injection technique: single-shot Needle Needle type: Introducer and Pencan  Needle gauge: 24 G Needle length: 9 cm Additional Notes Negative paresthesia. Negative blood return. Positive free-flowing CSF. Expiration date of kit checked and confirmed. Patient tolerated procedure well, without complications.

## 2016-04-04 NOTE — Transfer of Care (Signed)
Immediate Anesthesia Transfer of Care Note  Patient: Sarah Galvan  Procedure(s) Performed: Procedure(s): LEFT TOTAL KNEE ARTHROPLASTY (Left)  Patient Location: PACU  Anesthesia Type:Spinal  Level of Consciousness: awake, alert , oriented and patient cooperative  Airway & Oxygen Therapy: Patient Spontanous Breathing and Patient connected to nasal cannula oxygen  Post-op Assessment: Report given to RN and Post -op Vital signs reviewed and stable  Post vital signs: Reviewed and stable  Last Vitals:  Vitals:   04/04/16 0800  BP: (!) 162/73  Pulse: 67  Resp: 18  Temp: 36.8 C    Last Pain:  Vitals:   04/04/16 0800  TempSrc: Oral         Complications: No apparent anesthesia complications

## 2016-04-04 NOTE — Progress Notes (Signed)
Pt arrived to floor alert and orientated, with no complains

## 2016-04-04 NOTE — Discharge Summary (Addendum)
Patient ID: Sarah Galvan MRN: [id number] DOB/AGE: [birth year] 55 y.o.  Admit date: 04/04/2016 Discharge date: 04/06/2016  Admission Diagnoses:  Active Problems:   S/P total knee replacement   Discharge Diagnoses:  Same  Past Medical History:  Diagnosis Date  . Allergic rhinitis   . Allergy    seasonal  . Bronchitis    hx  . Cardiac murmur    dx 30 yr ago-echo done then-not since-  . GERD (gastroesophageal reflux disease)    no meds  . Hypothyroidism   . Osteoarthritis   . Pneumonia    2002 ish  . Seasonal allergies   . Sinus headache     Surgeries: Procedure(s): LEFT TOTAL KNEE ARTHROPLASTY on 04/04/2016   Consultants:   Discharged Condition: Improved  Hospital Course: Sarah Galvan is an 55 y.o. female who was admitted 04/04/2016 for operative treatment of primary localized osteoarthritis left knee. Patient has severe unremitting pain that affects sleep, daily activities, and work/hobbies. After pre-op clearance the patient was taken to the operating room on 04/04/2016 and underwent  Procedure(s): LEFT TOTAL KNEE ARTHROPLASTY.    Patient was given perioperative antibiotics:  Anti-infectives    Start     Dose/Rate Route Frequency Ordered Stop   04/04/16 1600  ceFAZolin (ANCEF) IVPB 2g/100 mL premix     2 g 200 mL/hr over 30 Minutes Intravenous Every 6 hours 04/04/16 1451 04/04/16 2256   04/04/16 0930  ceFAZolin (ANCEF) IVPB 2g/100 mL premix     2 g 200 mL/hr over 30 Minutes Intravenous To ShortStay Surgical 04/03/16 1339 04/04/16 1015       Patient was given sequential compression devices, early ambulation, and chemoprophylaxis to prevent DVT.  Patient benefited maximally from hospital stay and there were no complications.    Recent vital signs:  Patient Vitals for the past 24 hrs:  BP Temp Temp src Pulse Resp SpO2  04/06/16 0500 119/63 97.8 F (36.6 C) Oral 61 16 99 %  04/05/16 2000 (!) 156/74 98.1 F (36.7 C) Oral 80 16 98 %  04/05/16 1343  124/72 - - 68 17 98 %     Recent laboratory studies:   Recent Labs  04/05/16 0638 04/06/16 0214  WBC 8.5 13.0*  HGB 11.9* 11.8*  HCT 35.8* 35.3*  PLT 235 234  NA 131* 135  K 3.8 4.1  CL 100* 104  CO2 26 23  BUN 8 10  CREATININE 0.85 0.72  GLUCOSE 114* 130*  CALCIUM 8.0* 8.5*     Discharge Medications:     Medication List    STOP taking these medications   ibuprofen 200 MG tablet Commonly known as:  ADVIL,MOTRIN     TAKE these medications   albuterol 108 (90 Base) MCG/ACT inhaler Commonly known as:  PROVENTIL HFA;VENTOLIN HFA Inhale 2 puffs into the lungs every 6 (six) hours as needed for wheezing or shortness of breath.   apixaban 2.5 MG Tabs tablet Commonly known as:  ELIQUIS Take 1 tab po q12 hours x 14 days following surgery to prevent blood clots   cetirizine 10 MG tablet Commonly known as:  ZYRTEC Take 10 mg by mouth daily.   doxycycline 100 MG tablet Commonly known as:  VIBRA-TABS Take 1 tablet (100 mg total) by mouth 2 (two) times daily.   fluticasone 50 MCG/ACT nasal spray Commonly known as:  FLONASE Place 2 sprays into both nostrils daily. As needed What changed:  when to take this  reasons to take this  additional instructions  guaiFENesin-codeine 100-10 MG/5ML syrup Commonly known as:  ROBITUSSIN AC Take 10 mLs by mouth 3 (three) times daily as needed for cough.   levothyroxine 75 MCG tablet Commonly known as:  SYNTHROID, LEVOTHROID TAKE 1 TABLET(75 MCG) BY MOUTH DAILY   MENOPAUTONIC Caps Take 1 capsule by mouth 2 (two) times daily.   methocarbamol 500 MG tablet Commonly known as:  ROBAXIN Take 1 tablet (500 mg total) by mouth every 6 (six) hours as needed for muscle spasms.   multivitamin tablet Take 1 tablet by mouth daily. Mega Food Multivitamin for Women 40+   ondansetron 4 MG tablet Commonly known as:  ZOFRAN Take 1 tablet (4 mg total) by mouth every 8 (eight) hours as needed for nausea or vomiting.    oxyCODONE-acetaminophen 5-325 MG tablet Commonly known as:  PERCOCET Take 1-2 tablets by mouth every 4 (four) hours as needed for severe pain.       Diagnostic Studies: Dg Chest 2 View  Result Date: 03/23/2016 CLINICAL DATA:  Preoperative evaluation for upcoming knee replacement EXAM: CHEST  2 VIEW COMPARISON:  09/09/2014 FINDINGS: The heart size and mediastinal contours are within normal limits. Both lungs are clear. The visualized skeletal structures are unremarkable. IMPRESSION: No active cardiopulmonary disease. Electronically Signed   By: Alcide CleverMark  Lukens M.D.   On: 03/23/2016 09:36   Dg Knee Left Port  Result Date: 04/04/2016 CLINICAL DATA:  Post knee replacement EXAM: PORTABLE LEFT KNEE - 1-2 VIEW COMPARISON:  None. FINDINGS: Two views of the left knee submitted. There is left knee prosthesis with anatomic alignment. Postsurgical changes are noted with periarticular soft tissue air. IMPRESSION: Left knee prosthesis with anatomic alignment. Electronically Signed   By: Natasha MeadLiviu  Pop M.D.   On: 04/04/2016 12:47    Disposition: 01-Home or Self Care    Follow-up Information    Reesa ChewMURPHY,DANIEL W, MD. Schedule an appointment as soon as possible for a visit in 2 week(s).   Specialty:  Internal Medicine Contact information: [address] [place] [address] [phone]        [facility] .   Why:  Someone from [facility] will contact you to arrange start date and time for therapy. Contact information: [address] [place] [address] [phone]            Signed: Otilio SaberM Lindsey Danelle Curiale 04/06/2016, 8:40 AM

## 2016-04-05 ENCOUNTER — Encounter (HOSPITAL_COMMUNITY): Payer: Self-pay | Admitting: General Practice

## 2016-04-05 LAB — CBC
HCT: 35.8 % — ABNORMAL LOW (ref 36.0–46.0)
Hemoglobin: 11.9 g/dL — ABNORMAL LOW (ref 12.0–15.0)
MCH: 28.7 pg (ref 26.0–34.0)
MCHC: 33.2 g/dL (ref 30.0–36.0)
MCV: 86.3 fL (ref 78.0–100.0)
PLATELETS: 235 10*3/uL (ref 150–400)
RBC: 4.15 MIL/uL (ref 3.87–5.11)
RDW: 13.3 % (ref 11.5–15.5)
WBC: 8.5 10*3/uL (ref 4.0–10.5)

## 2016-04-05 LAB — BASIC METABOLIC PANEL
Anion gap: 5 (ref 5–15)
BUN: 8 mg/dL (ref 6–20)
CALCIUM: 8 mg/dL — AB (ref 8.9–10.3)
CO2: 26 mmol/L (ref 22–32)
CREATININE: 0.85 mg/dL (ref 0.44–1.00)
Chloride: 100 mmol/L — ABNORMAL LOW (ref 101–111)
GFR calc Af Amer: >60 mL/min (ref >60)
Glucose, Bld: 114 mg/dL — ABNORMAL HIGH (ref 65–99)
POTASSIUM: 3.8 mmol/L (ref 3.5–5.1)
SODIUM: 131 mmol/L — AB (ref 135–145)

## 2016-04-05 NOTE — Care Management Note (Signed)
Case Management Note  Patient Details  Name: Sarah LymeJudith Galvan MRN: [id number] Date of Birth: 1961/02/12  Subjective/Objective:  55 yr old female s/p left total  Knee arthroplasty.                Action/Plan: Patient was preoperatively setup with [facility], no changes. Will have family support at discharge. DME has been delivered to her home   Expected Discharge Date:   04/06/16               Expected Discharge Plan:  Home w Home Health Services  In-House Referral:     Discharge planning Services  CM Consult  Post Acute Care Choice:  Home Health Choice offered to:  Patient  DME Arranged:  N/A DME Agency:     HH Arranged:  PT HH Agency:  [facility]  Status of Service:  Completed, signed off  If discussed at Long Length of Stay Meetings, dates discussed:    Additional Comments:  Durenda GuthrieBrady, Jahkeem Kurka Naomi, RN 04/05/2016, 11:12 AM

## 2016-04-05 NOTE — Progress Notes (Signed)
Orthopedic Tech Progress Note Patient Details:  Sarah LymeJudith Galvan 18-Mar-1961 [id number]  CPM Left Knee CPM Left Knee: On Left Knee Flexion (Degrees): 90 Left Knee Extension (Degrees): 0 Additional Comments: LLE Applied CPM at 0-90.  Alvina ChouWilliams, Isaak Delmundo C 04/05/2016, 7:01 AM
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
## 2016-04-05 NOTE — Progress Notes (Signed)
Subjective: 1 Day Post-Op Procedure(s) (LRB): LEFT TOTAL KNEE ARTHROPLASTY (Left) Patient reports pain as mild.    Objective: Vital signs in last 24 hours: Temp:  [97.3 F (36.3 C)-98.5 F (36.9 C)] 98.2 F (36.8 C) (11/02 0404) Pulse Rate:  [53-71] 62 (11/02 0404) Resp:  [16-18] 16 (11/02 0404) BP: (103-162)/(53-95) 103/56 (11/02 0404) SpO2:  [94 %-100 %] 98 % (11/02 0404) Weight:  [91.2 kg (201 lb)] 91.2 kg (201 lb) (11/01 0800)  Intake/Output from previous day: 11/01 0701 - 11/02 0700 In: 3175.8 [P.O.:700; I.V.:2265.8; IV Piggyback:210] Out: 2150 [Urine:2000; Blood:150] Intake/Output this shift: No intake/output data recorded.  No results for input(s): HGB in the last 72 hours. No results for input(s): WBC, RBC, HCT, PLT in the last 72 hours. No results for input(s): NA, K, CL, CO2, BUN, CREATININE, GLUCOSE, CALCIUM in the last 72 hours. No results for input(s): LABPT, INR in the last 72 hours.  Neurologically intact Neurovascular intact Sensation intact distally Intact pulses distally Dorsiflexion/Plantar flexion intact Incision: dressing C/D/I No cellulitis present Compartment soft  Assessment/Plan: 1 Day Post-Op Procedure(s) (LRB): LEFT TOTAL KNEE ARTHROPLASTY (Left) Advance diet Up with therapy D/C IV fluids Plan for discharge tomorrow home with hhpt WBAT LLE Please remove ACE bandage today and place ted hose BLE  Otilio SaberM Lindsey Hawk Mones 04/05/2016, 7:19 AM

## 2016-04-06 LAB — BASIC METABOLIC PANEL
ANION GAP: 8 (ref 5–15)
BUN: 10 mg/dL (ref 6–20)
CALCIUM: 8.5 mg/dL — AB (ref 8.9–10.3)
CO2: 23 mmol/L (ref 22–32)
Chloride: 104 mmol/L (ref 101–111)
Creatinine, Ser: 0.72 mg/dL (ref 0.44–1.00)
GFR calc Af Amer: >60 mL/min (ref >60)
Glucose, Bld: 130 mg/dL — ABNORMAL HIGH (ref 65–99)
POTASSIUM: 4.1 mmol/L (ref 3.5–5.1)
SODIUM: 135 mmol/L (ref 135–145)

## 2016-04-06 LAB — CBC
HCT: 35.3 % — ABNORMAL LOW (ref 36.0–46.0)
Hemoglobin: 11.8 g/dL — ABNORMAL LOW (ref 12.0–15.0)
MCH: 28.9 pg (ref 26.0–34.0)
MCHC: 33.4 g/dL (ref 30.0–36.0)
MCV: 86.3 fL (ref 78.0–100.0)
PLATELETS: 234 10*3/uL (ref 150–400)
RBC: 4.09 MIL/uL (ref 3.87–5.11)
RDW: 13 % (ref 11.5–15.5)
WBC: 13 10*3/uL — AB (ref 4.0–10.5)
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
## 2016-04-06 NOTE — Progress Notes (Signed)
Patient ID: Barrie LymeJudith Galvan, female   DOB: Feb 25, 1961, 55 y.o.   MRN: [id number]   Spoke to patient today.  She is doing well and will be d/c home today after either first or second session of PT (her choice).  Please remove ace bandage and apply ted hose prior to d/c.

## 2016-04-06 NOTE — Op Note (Signed)
NAMMarland Kitchen:  Sarah BellSCHMIDINGER, Sarah Galvan          ACCOUNT NO.:  [id number]  MEDICAL RECORD NO.:  [id number]  LOCATION:  [id number]                        FACILITY:  [facility]  PHYSICIAN:  Sarah Galvan, M.D. DATE OF BIRTH:  May 15, 1961  DATE OF PROCEDURE:  04/04/2016 DATE OF DISCHARGE:                              OPERATIVE REPORT   PREOPERATIVE DIAGNOSES:  Left knee end-stage arthritis, primary, localized with valgus alignment, mild bone loss lateral compartment.  POSTOPERATIVE DIAGNOSES:  Left knee end-stage arthritis, primary, localized with valgus alignment, mild bone loss lateral compartment.  PROCEDURES:  Modified, minimally invasive left total knee replacement with Stryker triathlon prosthesis.  Cemented pegged cruciate retaining #3 femoral component.  Cemented #4 tibial component, 9-mm CS insert. Cemented resurfacing 32-mm patellar component.  SURGEON:  Sarah Galvan, M.D.  ASSISTANT:  Sarah Galvan, P.A., present throughout the entire case and necessary for timely completion of procedure.  ANESTHESIA:  Spinal.  BLOOD LOSS:  Minimal.  SPECIMENS:  None.  CULTURES:  None.  COMPLICATIONS:  None.  DRESSINGS:  Soft compressive knee immobilizer.  TOURNIQUET TIME:  50 minutes.  DESCRIPTION OF PROCEDURE:  The patient was brought to the operating room and after adequate anesthesia had been obtained, tourniquet applied, prepped and draped in usual sterile fashion.  Exsanguinated with elevation of Esmarch.  Tourniquet inflated to 350 mmHg.  A longitudinal incision above the patella to the tibial tubercle.  Medial arthrotomy, vastus splitting, preserving quad tendon.  Marked changes laterally. Valgus alignment, but this is correctable.  Flexible intramedullary guide, distal femur.  8 mm resection, 5 degrees of valgus.  Using epicondylar axis, the femur was sized, cut, and fitted for a pegged cruciate retaining #3 component.  Proximal tibial resection with extramedullary guide.  Size  #4 component rotation set with trials and the tibia was reamed.  Resurfacing of the patella after 10 mm resection with a 32-mm component.  With trials in place, 9 mm insert.  Very pleased with balancing, stability, flexion, extension, patellar tracking.  Trial was removed.  Copious irrigation.  Cement prepared, placed on all components, firmly seated.  Polyethylene attached to tibia, knee reduced.  Patella held with a clamp.  Once the cement hardened, the knee was irrigated again.  Soft tissue was injected with Exparel.  Arthrotomy closed with #1 Vicryl in the subcutaneous subcuticular closure.  Margins were injected with Marcaine.  Sterile compressive dressing applied.  Tourniquet deflated and removed.  Knee immobilizer applied.  Anesthesia reversed.  Brought to the recovery room.  Tolerated the surgery well.  No complications.     Sarah Galvan, M.D.     DFM/MEDQ  D:  04/05/2016  T:  04/06/2016  Job:  [id number]

## 2016-04-06 NOTE — Progress Notes (Signed)
Reviewed discharge information/medications with patient.  Answered all of her questions.  Waiting on her husband.

## 2016-05-24 ENCOUNTER — Telehealth: Payer: BC Managed Care – PPO | Admitting: Family

## 2016-05-24 DIAGNOSIS — R197 Diarrhea, unspecified: Secondary | ICD-10-CM

## 2016-05-24 MED ORDER — AZITHROMYCIN 500 MG PO TABS: 500.0000 mg | ORAL_TABLET | Freq: Every day | ORAL | 0 refills | Status: AC

## 2016-05-24 NOTE — Progress Notes (Signed)
We are sorry that you are not feeling well.  Here is how we plan to help!  Based on what you have shared with me it looks like you have Acute Infectious Diarrhea.  Most cases of acute diarrhea are due to infections with virus and bacteria and are self-limited conditions lasting less than 14 days.  For your symptoms you may take Imodium 2 mg tablets that are over the counter at your local pharmacy. Take two tablet now and then one after each loose stool up to 6 a day.  Antibiotics are not needed for most people with diarrhea.    Optional: I have sent in azithromycin 500 mg daily for 3 days.  HOME CARE  We recommend changing your diet to help with your symptoms for the next few days.  Drink plenty of fluids that contain water salt and sugar. Sports drinks such as Gatorade may help.   You may try broths, soups, bananas, applesauce, soft breads, mashed potatoes or crackers.   You are considered infectious for as long as the diarrhea continues. Hand washing or use of alcohol based hand sanitizers is recommend.  It is best to stay out of work or school until your symptoms stop.   GET HELP RIGHT AWAY  If you have dark yellow colored urine or do not pass urine frequently you should drink more fluids.    If your symptoms worsen   If you feel like you are going to pass out (faint)  You have a new problem  MAKE SURE YOU   Understand these instructions.  Will watch your condition.  Will get help right away if you are not doing well or get worse.  Your e-visit answers were reviewed by a board certified advanced clinical practitioner to complete your personal care plan.  Depending on the condition, your plan could have included both over the counter or prescription medications.  If there is a problem please reply  once you have received a response from your provider.  Your safety is important to us.  If you have drug allergies check your prescription carefully.    You can use  MyChart to ask questions about today's visit, request a non-urgent call back, or ask for a work or school excuse for 24 hours related to this e-Visit. If it has been greater than 24 hours you will need to follow up with your provider, or enter a new e-Visit to address those concerns.   You will get an e-mail in the next two days asking about your experience.  I hope that your e-visit has been valuable and will speed your recovery. Thank you for using e-visits.   

## 2016-09-05 ENCOUNTER — Encounter: Payer: Self-pay | Admitting: Physician Assistant

## 2016-09-05 ENCOUNTER — Ambulatory Visit (INDEPENDENT_AMBULATORY_CARE_PROVIDER_SITE_OTHER): Payer: BC Managed Care – PPO | Admitting: Physician Assistant

## 2016-09-05 VITALS — BP 124/82 | HR 59 | Temp 98.1°F | Resp 14 | Ht 63.0 in | Wt 205.0 lb

## 2016-09-05 DIAGNOSIS — L739 Follicular disorder, unspecified: Secondary | ICD-10-CM | POA: Diagnosis not present

## 2016-09-05 MED ORDER — MUPIROCIN CALCIUM 2 % EX CREA: 1.0000 "application " | TOPICAL_CREAM | Freq: Two times a day (BID) | CUTANEOUS | 0 refills | Status: AC

## 2016-09-05 NOTE — Progress Notes (Signed)
Patient presents to clinic today c/o 1 month recurrence of folliculitis of scalp. Endorses diagnosed one year ago and had mostly resolved at time of evaluation. Was told to follow-up if there is any recurrence. Notes lesions of scalp that are itching and painful. Tries to avoid picking at the area. Started after wearing her hat out in the heat. Noted increased sweating. Has also noted similar lesions of the upper chest and posterior neck. No lesions noted elsewhere. Denies fever, chills, malaise or fatigue. Denies contact with similar lesions.   Past Medical History:  Diagnosis Date  . Allergic rhinitis   . Allergy    seasonal  . Bronchitis    hx  . Cardiac murmur    dx 30 yr ago-echo done then-not since-  . GERD (gastroesophageal reflux disease)    no meds  . Hypothyroidism   . Osteoarthritis   . Pneumonia    2002 ish  . Seasonal allergies   . Sinus headache     Current Outpatient Prescriptions on File Prior to Visit  Medication Sig Dispense Refill  . albuterol (PROVENTIL HFA;VENTOLIN HFA) 108 (90 Base) MCG/ACT inhaler Inhale 2 puffs into the lungs every 6 (six) hours as needed for wheezing or shortness of breath. 1 Inhaler 2  . fluticasone (FLONASE) 50 MCG/ACT nasal spray Place 2 sprays into both nostrils daily. As needed (Patient taking differently: Place 2 sprays into both nostrils daily as needed (for allergies). ) 16 g 3  . levothyroxine (SYNTHROID, LEVOTHROID) 75 MCG tablet TAKE 1 TABLET(75 MCG) BY MOUTH DAILY 30 tablet 6  . Multiple Vitamin (MULTIVITAMIN) tablet Take 1 tablet by mouth daily. Mega Food Multivitamin for Women 40+    . Misc Natural Products [facility]) CAPS Take 1 capsule by mouth 2 (two) times daily.      No current facility-administered medications on file prior to visit.     Allergies  Allergen Reactions  . No Known Allergies     Family History  Problem Relation Age of Onset  . Lung cancer Father     Oct 2010  . Colon polyps Father   . Alcohol  abuse    . Arthritis    . Hyperlipidemia    . Hypertension    . Breast cancer    . Mental illness    . Bladder Cancer Maternal Aunt     Social History   Social History  . Marital status: Married    Spouse name: N/A  . Number of children: N/A  . Years of education: N/A   Social History Main Topics  . Smoking status: Never Smoker  . Smokeless tobacco: Never Used  . Alcohol use Yes     Comment: occ.- holidays  . Drug use: No  . Sexual activity: Not Asked   Other Topics Concern  . None   Social History Narrative   Coaches traveling Automotive engineer.   Review of Systems - See HPI.  All other ROS are negative.  BP 124/82   Pulse (!) 59   Temp 98.1 F (36.7 C) (Oral)   Resp 14   Ht  (1.6 m)   Wt 205 lb (93 kg)   SpO2 98%   BMI 36.31 kg/m   Physical Exam  Constitutional: She is oriented to person, place, and time and well-developed, well-nourished, and in no distress.  HENT:  Head: Normocephalic and atraumatic.  Eyes: Conjunctivae are normal.  Neck: Neck supple.  Cardiovascular: Normal rate, regular rhythm, normal heart sounds and  intact distal pulses.   Pulmonary/Chest: Effort normal and breath sounds normal. No respiratory distress. She has no wheezes. She has no rales. She exhibits no tenderness.  Lymphadenopathy:    She has no cervical adenopathy.  Neurological: She is alert and oriented to person, place, and time.  Skin: Skin is warm and dry.     Vitals reviewed.  Assessment/Plan: 1. Folliculitis Worse of anterior chest. Some residual lesions of occipital region. No evidence of other infection. No evidence of pediculosis capitis. Supportive measures discussed. Rx Mupirocin as directed. OTC Scalpicin. Dermatology if symptoms are not resolved.    Piedad Climes, PA-C

## 2016-09-05 NOTE — Progress Notes (Signed)
Pre visit review using our clinic review tool, if applicable. No additional management support is needed unless otherwise documented below in the visit note. 

## 2016-09-05 NOTE — Patient Instructions (Addendum)
Please keep skin clean and dry.  Apply the mupirocin cream to effected areas twice daily over the next week.  Get some over-the-counter Scalpicin to help with any itching of the scalp. Avoid wearing hats or clothing that does not breathe well.   If not improving we will need to have you see Dermatology. Once resolved, would recommend washing 1-2 x week with an antibacterial soap.

## 2016-09-18 ENCOUNTER — Encounter: Payer: Self-pay | Admitting: Family Medicine

## 2016-09-21 ENCOUNTER — Other Ambulatory Visit: Payer: Self-pay | Admitting: Family Medicine

## 2016-09-24 ENCOUNTER — Encounter: Payer: Self-pay | Admitting: Family Medicine

## 2016-09-24 ENCOUNTER — Ambulatory Visit (INDEPENDENT_AMBULATORY_CARE_PROVIDER_SITE_OTHER): Payer: BC Managed Care – PPO | Admitting: Family Medicine

## 2016-09-24 VITALS — BP 122/82 | HR 81 | Temp 98.0°F | Resp 17 | Ht 63.0 in | Wt 205.4 lb

## 2016-09-24 DIAGNOSIS — R234 Changes in skin texture: Secondary | ICD-10-CM

## 2016-09-24 DIAGNOSIS — R239 Unspecified skin changes: Secondary | ICD-10-CM

## 2016-09-24 MED ORDER — TRIAMCINOLONE ACETONIDE 0.1 % EX OINT: 1.0000 "application " | TOPICAL_OINTMENT | Freq: Two times a day (BID) | CUTANEOUS | 1 refills | Status: AC

## 2016-09-24 NOTE — Patient Instructions (Signed)
Follow up as needed/scheduled Switch to Neutrogena Oil Free body wash Use the Triamcinolone ointment on the areas as they appear Moisturize at least twice daily Call with any questions or concerns- particularly if not improving or worsening (we can send you to Derm) Hang in there!!!

## 2016-09-24 NOTE — Progress Notes (Signed)
   Subjective:    Patient ID: Sarah Galvan, female    DOB: 05/03/1961, 56 y.o.   MRN: [id number]  HPI Folliculitis- pt reports she develops large, fluid filled itchy areas on chest, back, and arms and legs.  sxs started in scalp.  Not painful- 'it's itchy more than anything'.  Pt is perimenopausal- didn't have periods for 9 months but did have one in March.  Pt reports very dry skin for the last month.   Review of Systems For ROS see HPI     Objective:   Physical Exam  Constitutional: She is oriented to person, place, and time. She appears well-developed and well-nourished. No distress.  HENT:  Head: Normocephalic and atraumatic.  Neurological: She is alert and oriented to person, place, and time.  Skin: Skin is warm and dry.  Pt w/ small, scabbed areas on chest, back, and arms.  No vesicles or white heads noted.  No excoriations.  No evidence of infection  Psychiatric: She has a normal mood and affect. Her behavior is normal. Thought content normal.  Vitals reviewed.         Assessment & Plan:  Skin changes- I suspect that this is due to her perimenopausal state and more hormonally related.  Encouraged pt to use oil free acne wash, moisturize and if areas break out, start abx ointment or steroid cream.  If no improvement, will refer to derm.  Pt expressed understanding and is in agreement w/ plan.

## 2016-09-24 NOTE — Progress Notes (Signed)
Pre visit review using our clinic review tool, if applicable. No additional management support is needed unless otherwise documented below in the visit note. 

## 2016-12-17 ENCOUNTER — Encounter: Payer: BC Managed Care – PPO | Admitting: Family Medicine

## 2017-04-09 ENCOUNTER — Encounter: Payer: Self-pay | Admitting: Family Medicine

## 2017-04-10 ENCOUNTER — Other Ambulatory Visit: Payer: Self-pay | Admitting: *Deleted

## 2017-04-10 MED ORDER — LEVOTHYROXINE SODIUM 75 MCG PO TABS: ORAL_TABLET | ORAL | 0 refills | Status: AC

## 2017-04-15 ENCOUNTER — Ambulatory Visit: Payer: BC Managed Care – PPO | Admitting: Family Medicine

## 2017-10-23 ENCOUNTER — Encounter: Payer: Self-pay | Admitting: General Practice

## 2017-12-16 IMAGING — CR DG KNEE 1-2V PORT*L*
2 series · 2 of 2 positions shown · non-contrast
Comparison: None.

CLINICAL DATA: Post knee replacement

EXAM:
PORTABLE LEFT KNEE - 1-2 VIEW

[AP]
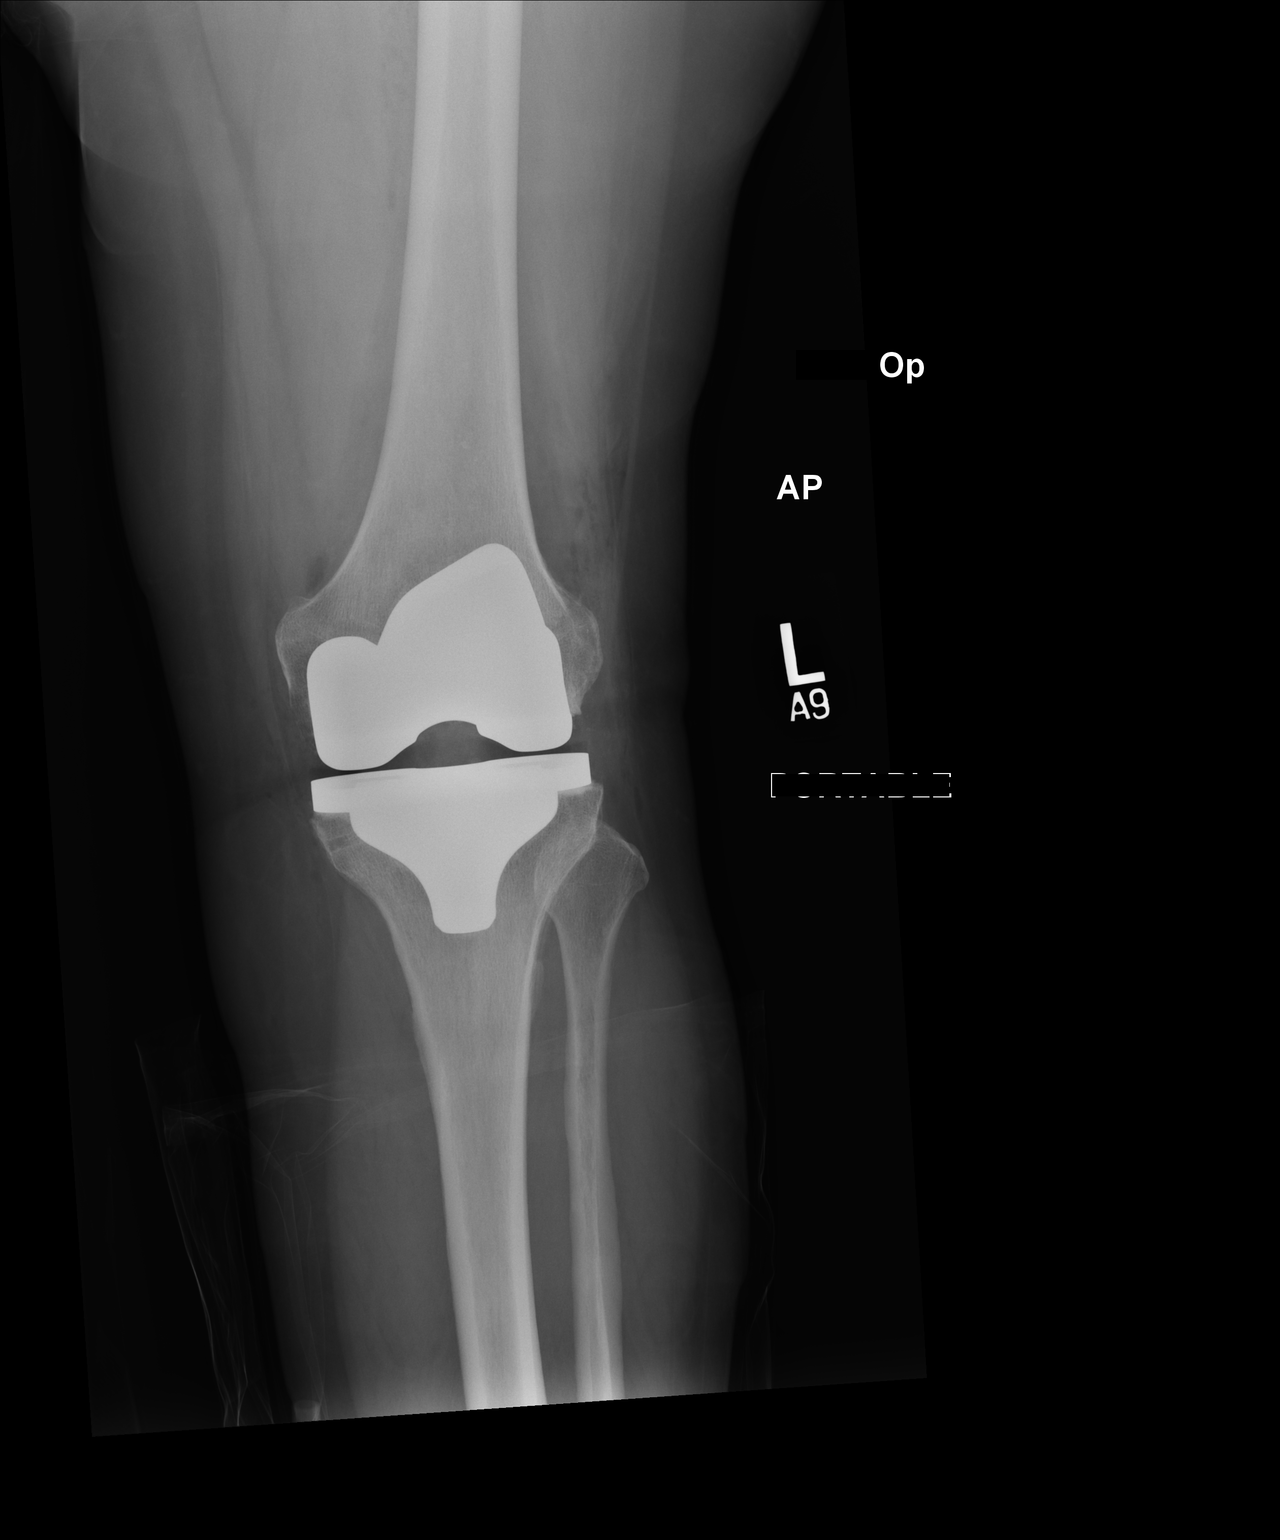

[xtable lateral]
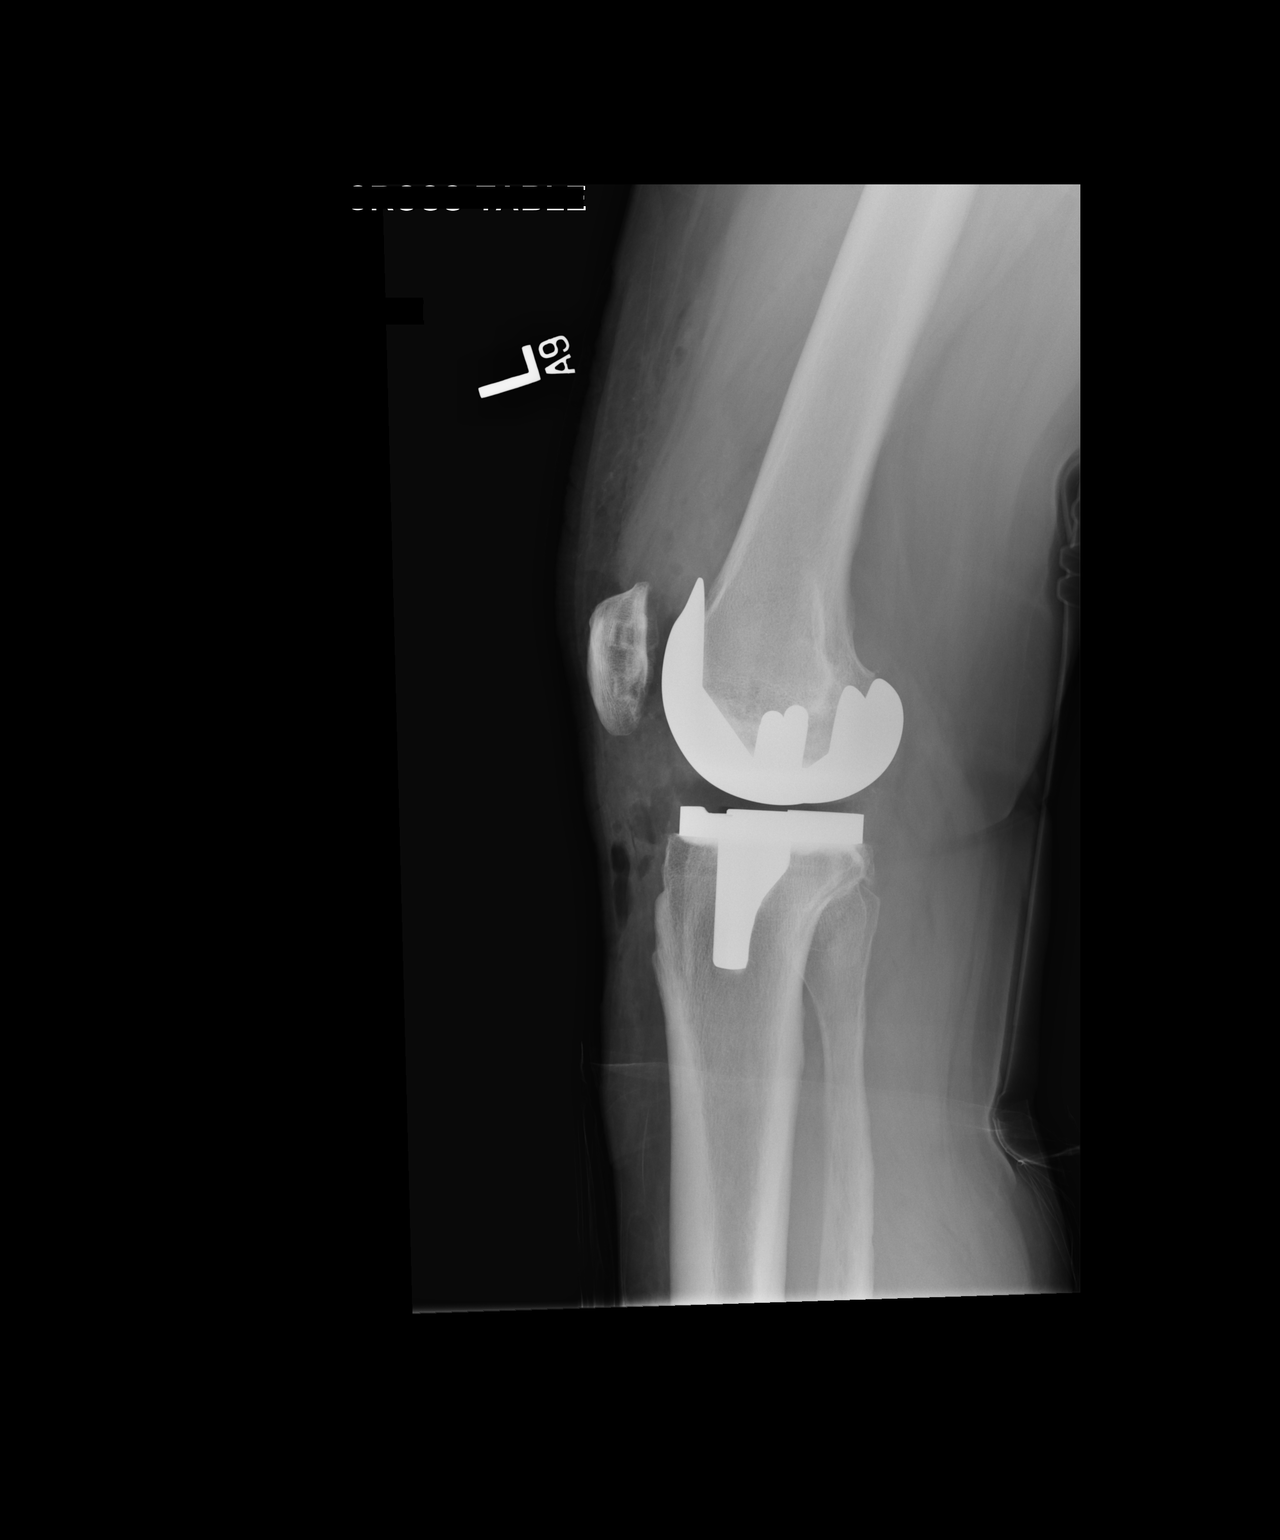

[2 of 2 positions shown; findings below may reference images not displayed]

FINDINGS: Two views of the left knee submitted. There is left knee prosthesis
with anatomic alignment. Postsurgical changes are noted with
periarticular soft tissue air.
IMPRESSION: Left knee prosthesis with anatomic alignment.
